# Patient Record
Sex: Male | Born: 1950 | Race: White | Hispanic: No | Smoking: Former smoker
Health system: Southern US, Community
[De-identification: ages and names within clinical notes are randomized; demographics above are authoritative.]

## PROBLEM LIST (undated history)

## (undated) DIAGNOSIS — E785 Hyperlipidemia, unspecified: Secondary | ICD-10-CM

## (undated) DIAGNOSIS — K429 Umbilical hernia without obstruction or gangrene: Secondary | ICD-10-CM

## (undated) DIAGNOSIS — E559 Vitamin D deficiency, unspecified: Secondary | ICD-10-CM

## (undated) HISTORY — PX: HERNIA REPAIR: SHX51

## (undated) HISTORY — PX: TONSILLECTOMY: SUR1361

---

## 2005-03-30 ENCOUNTER — Ambulatory Visit: Payer: Self-pay | Admitting: Gastroenterology

## 2006-04-17 ENCOUNTER — Ambulatory Visit: Payer: Self-pay | Admitting: Family Medicine

## 2007-12-29 ENCOUNTER — Ambulatory Visit: Payer: Self-pay | Admitting: Family Medicine

## 2008-05-31 ENCOUNTER — Emergency Department: Payer: Self-pay | Admitting: Unknown Physician Specialty

## 2008-06-24 ENCOUNTER — Ambulatory Visit: Payer: Self-pay | Admitting: Gastroenterology

## 2011-08-17 ENCOUNTER — Ambulatory Visit: Payer: Self-pay | Admitting: Gastroenterology

## 2015-03-04 ENCOUNTER — Emergency Department
Admission: EM | Admit: 2015-03-04 | Discharge: 2015-03-04 | Disposition: A | Discharge status: Home / Self Care | Payer: Commercial Indemnity | Attending: Emergency Medicine | Admitting: Emergency Medicine

## 2015-03-04 ENCOUNTER — Emergency Department: Payer: Commercial Indemnity

## 2015-03-04 ENCOUNTER — Encounter: Payer: Self-pay | Admitting: Emergency Medicine

## 2015-03-04 DIAGNOSIS — R109 Unspecified abdominal pain: Secondary | ICD-10-CM | POA: Diagnosis present

## 2015-03-04 DIAGNOSIS — K429 Umbilical hernia without obstruction or gangrene: Secondary | ICD-10-CM

## 2015-03-04 HISTORY — DX: Umbilical hernia without obstruction or gangrene: K42.9

## 2015-03-04 HISTORY — DX: Hyperlipidemia, unspecified: E78.5

## 2015-03-04 LAB — CBC WITH DIFFERENTIAL/PLATELET
BASOS ABS: 0 10*3/uL (ref 0–0.1)
Basophils Relative: 1 %
EOS PCT: 3 %
Eosinophils Absolute: 0.1 10*3/uL (ref 0–0.7)
HCT: 47.5 % (ref 40.0–52.0)
Hemoglobin: 16 g/dL (ref 13.0–18.0)
LYMPHS ABS: 1.3 10*3/uL (ref 1.0–3.6)
Lymphocytes Relative: 26 %
MCH: 30.7 pg (ref 26.0–34.0)
MCHC: 33.6 g/dL (ref 32.0–36.0)
MCV: 91.5 fL (ref 80.0–100.0)
Monocytes Absolute: 0.6 10*3/uL (ref 0.2–1.0)
Monocytes Relative: 11 %
Neutro Abs: 3 10*3/uL (ref 1.4–6.5)
Neutrophils Relative %: 59 %
PLATELETS: 177 10*3/uL (ref 150–440)
RBC: 5.19 MIL/uL (ref 4.40–5.90)
RDW: 14 % (ref 11.5–14.5)
WBC: 5.1 10*3/uL (ref 3.8–10.6)

## 2015-03-04 LAB — COMPREHENSIVE METABOLIC PANEL
ALT: 41 U/L (ref 17–63)
ANION GAP: 8 (ref 5–15)
AST: 37 U/L (ref 15–41)
Albumin: 4.3 g/dL (ref 3.5–5.0)
Alkaline Phosphatase: 72 U/L (ref 38–126)
BUN: 13 mg/dL (ref 6–20)
CALCIUM: 9.1 mg/dL (ref 8.9–10.3)
CHLORIDE: 107 mmol/L (ref 101–111)
CO2: 25 mmol/L (ref 22–32)
CREATININE: 1.01 mg/dL (ref 0.61–1.24)
GFR calc Af Amer: >60 mL/min (ref >60)
Glucose, Bld: 104 mg/dL — ABNORMAL HIGH (ref 65–99)
Potassium: 4.2 mmol/L (ref 3.5–5.1)
Sodium: 140 mmol/L (ref 135–145)
Total Bilirubin: 1 mg/dL (ref 0.3–1.2)
Total Protein: 7.3 g/dL (ref 6.5–8.1)

## 2015-03-04 MED ORDER — IOHEXOL 300 MG/ML  SOLN
100.0000 mL | Freq: Once | INTRAMUSCULAR | Status: AC | PRN
  Administered 2015-03-04: 100 mL via INTRAVENOUS

## 2015-03-04 MED ORDER — IOHEXOL 240 MG/ML SOLN
25.0000 mL | Freq: Once | INTRAMUSCULAR | Status: AC | PRN
  Administered 2015-03-04: 25 mL via ORAL

## 2015-03-04 NOTE — ED Notes (Signed)
Patient transported to CT 

## 2015-03-04 NOTE — ED Notes (Signed)
Pt to ed with c/o umbilical hernia hurting x 3 days.  Pt states was noted on last yearly evaluation by MD.  Pt states over last few days it has been "popping out" and causing pain.

## 2015-03-04 NOTE — ED Provider Notes (Signed)
-----------------------------------------   4:24 PM on 03/04/2015 -----------------------------------------  CT shows a small fat-containing umbilical hernia without incarceration of bowel loop, mild surrounding inflammatory changes. Patient continues to appear well. Discussed return precautions and need for outpatient surgical follow-up. He is comfortable with discharge plan.  Joanne Gavel, MD 03/04/15 [phone]

## 2015-03-04 NOTE — ED Provider Notes (Signed)
[facility] Emergency Department Provider Note  ____________________________________________  Time seen: 2 PM  I have reviewed the triage vital signs and the nursing notes.   HISTORY  Chief Complaint Abdominal Pain     HPI Marvin Coleman is a 64 y.o. male who presents with pain around his umbilicus for approximately 3 days. Patient reports he has had umbilical and inguinal hernia repairs.  He noted his doctor found a firm area above the umbilicus and he feels that it is getting larger and more painful. He reports no bowel movements, normal flatus. No fevers no chills.      Past Medical History  Diagnosis Date  . Umbilical hernia   . Hyperlipemia     There are no active problems to display for this patient.   Past Surgical History  Procedure Laterality Date  . Hernia repair      No current outpatient prescriptions on file.  Allergies Review of patient's allergies indicates no known allergies.  Family History  Problem Relation Age of Onset  . CVA Mother   . Cancer Father     Social History History  Substance Use Topics  . Smoking status: Never Smoker   . Smokeless tobacco: Not on file  . Alcohol Use: Yes    Review of Systems  Constitutional: Negative for fever. Eyes: Negative for visual changes. ENT: Negative for sore throat Cardiovascular: Negative for chest pain. Respiratory: Negative for shortness of breath. Gastrointestinal: Mild discomfort around umbilicus Genitourinary: Negative for dysuria. Musculoskeletal: Negative for back pain. Skin: Negative for rash. Neurological: Negative for headaches or focal weakness Psychiatric: No anxiety  10-point ROS otherwise negative.  ____________________________________________   PHYSICAL EXAM:  VITAL SIGNS: ED Triage Vitals  Enc Vitals Group     BP 03/04/15 1202 140/84 mmHg     Pulse Rate 03/04/15 1202 63     Resp 03/04/15 1202 20     Temp 03/04/15 1202 98.6 F (37 C)      Temp Source 03/04/15 1330 Oral     SpO2 03/04/15 1202 97 %     Weight 03/04/15 1202 215 lb (97.523 kg)     Height 03/04/15 1202 5\' 8"  (1.727 m)     Head Cir --      Peak Flow --      Pain Score 03/04/15 1202 4     Pain Loc --      Pain Edu? --      Excl. in [place]? --      Constitutional: Alert and oriented. Well appearing and in no distress. Eyes: Conjunctivae are normal.  ENT   Head: Normocephalic and atraumatic.   Mouth/Throat: Mucous membranes are moist. Cardiovascular: Normal rate, regular rhythm. Normal and symmetric distal pulses are present in all extremities. No murmurs, rubs, or gallops. Respiratory: Normal respiratory effort without tachypnea nor retractions. Breath sounds are clear and equal bilaterally.  Gastrointestinal: Soft and non-tender in all quadrants. No distention. 2 x 2 centimeter firm nodular structure at the 11:00 position of the umbilicus. No tenderness to palpation. No erythema Genitourinary: deferred Musculoskeletal: Nontender with normal range of motion in all extremities. No lower extremity tenderness nor edema. Neurologic:  Normal speech and language. No gross focal neurologic deficits are appreciated. Skin:  Skin is warm, dry and intact. No rash noted. Psychiatric: Mood and affect are normal. Patient exhibits appropriate insight and judgment.  ____________________________________________    LABS (pertinent positives/negatives)  Labs Reviewed  COMPREHENSIVE METABOLIC PANEL - Abnormal; Notable for the following:  Glucose, Bld 104 (*)    All other components within normal limits  CBC WITH DIFFERENTIAL/PLATELET    ____________________________________________   EKG  None  ____________________________________________    RADIOLOGY  Pending CT scan  ____________________________________________   PROCEDURES  Procedure(s) performed: none  Critical Care performed:  none  ____________________________________________   INITIAL IMPRESSION / ASSESSMENT AND PLAN / ED COURSE  Pertinent labs & imaging results that were available during my care of the patient were reviewed by me and considered in my medical decision making (see chart for details).  Patient overall well-appearing. Unclear cause of discomfort, we'll obtain CT abdomen pelvis to rule out hernia.  ____________________________________________ ----------------------------------------- 3:26 PM on 03/04/2015 -----------------------------------------  Signed out to Dr. Edd Fabian to follow up on CT  FINAL CLINICAL IMPRESSION(S) / ED DIAGNOSES  Abdominal pain   Lavonia Drafts, MD 03/04/15 1527

## 2015-11-12 IMAGING — CT CT ABD-PELV W/ CM
1 of 3 series · 14 of 32 positions shown, 19 images · IV contrast (omnipaque)
Comparison: None.

CLINICAL DATA: Known history of umbilical hernia with pain for 3
days

EXAM:
CT ABDOMEN AND PELVIS WITH CONTRAST
TECHNIQUE: Multidetector CT imaging of the abdomen and pelvis was performed
using the standard protocol following bolus administration of
intravenous contrast.
CONTRAST:  100mL OMNIPAQUE IOHEXOL 300 MG/ML  SOLN

[Series 2: routine abd pel with · axial · 0.80mm/px · z∈[-488,-58]mm · 14 of 98 slices shown, 19 images]
[im 6/98  soft-tissue]
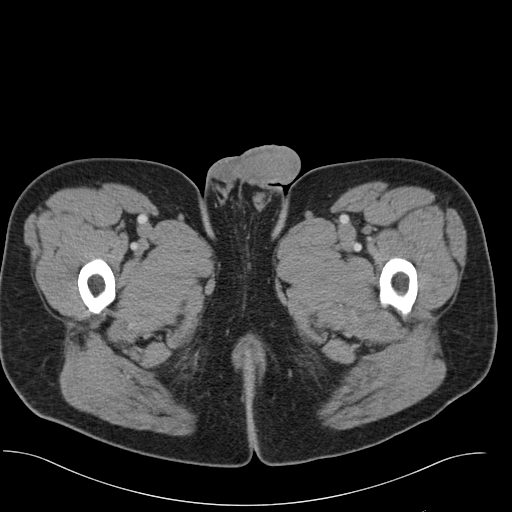
[im 6/98  bone]
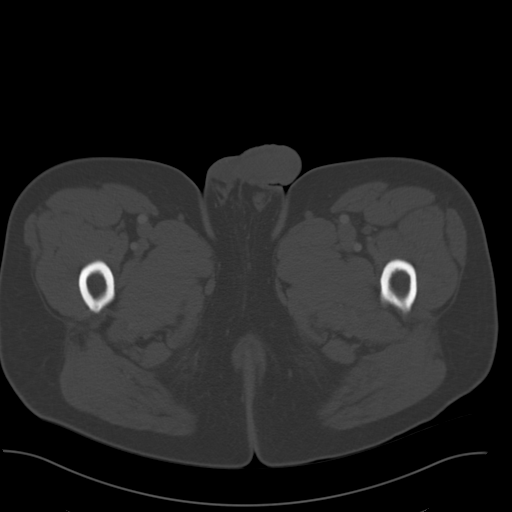
[im 11/98  soft-tissue]
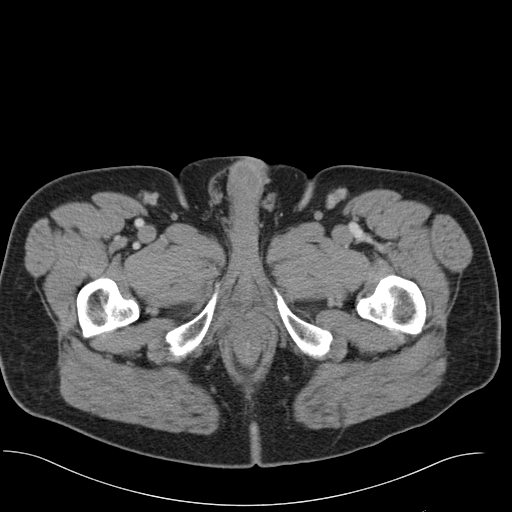
[im 22/98  soft-tissue]
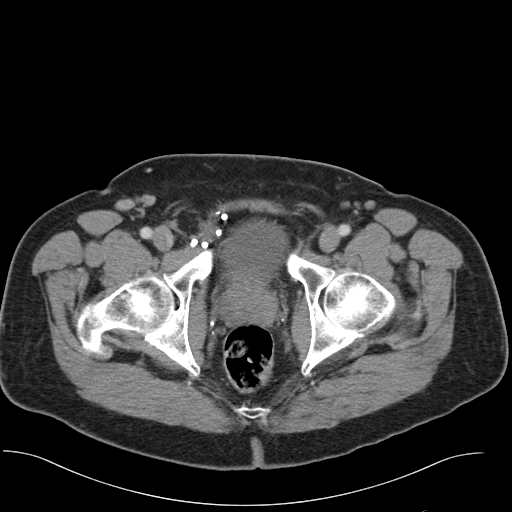
[im 27/98  soft-tissue]
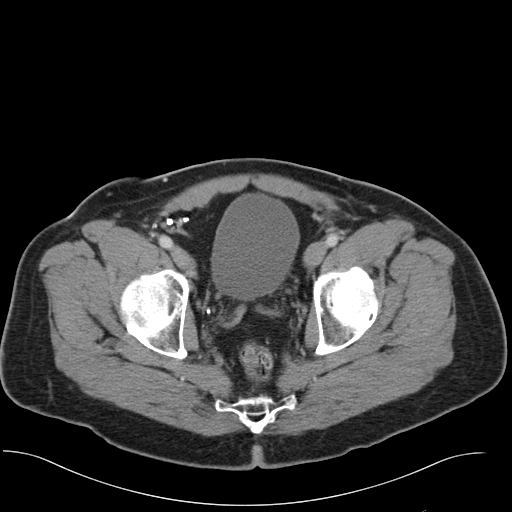
[im 33/98  soft-tissue]
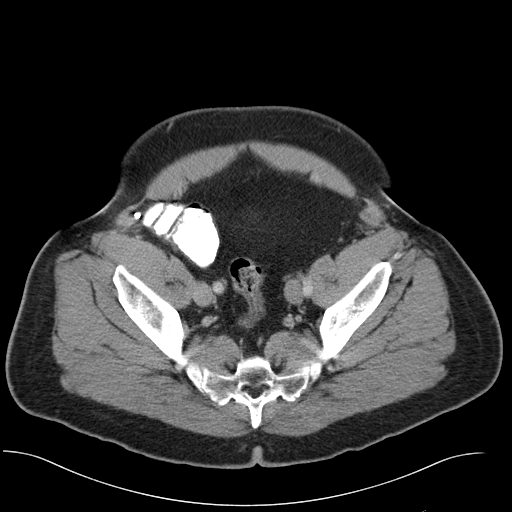
[im 44/98  soft-tissue]
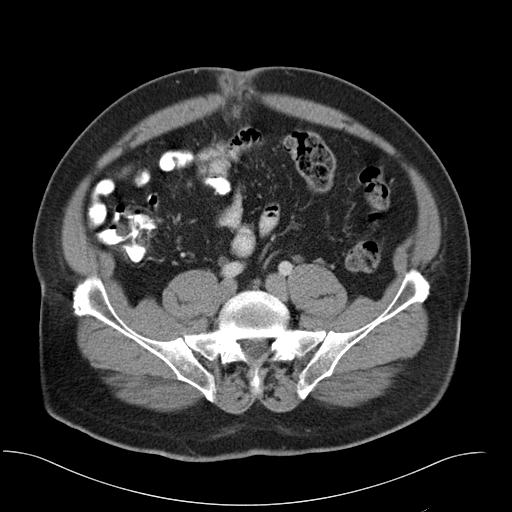
[im 49/98  soft-tissue]
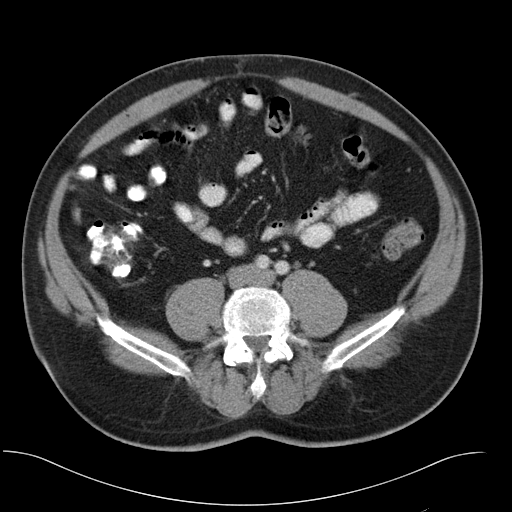
[im 54/98  soft-tissue]
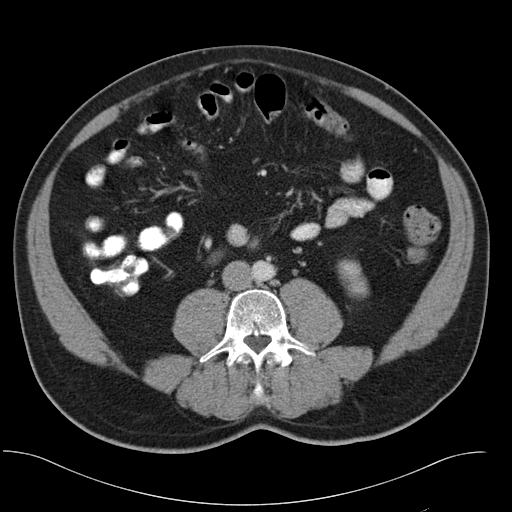
[im 65/98  soft-tissue]
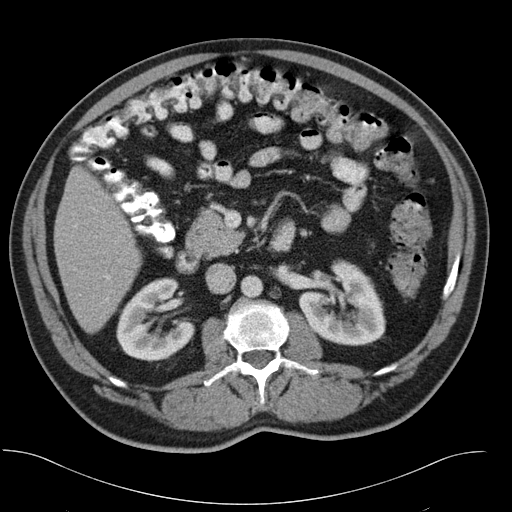
[im 65/98  bone]
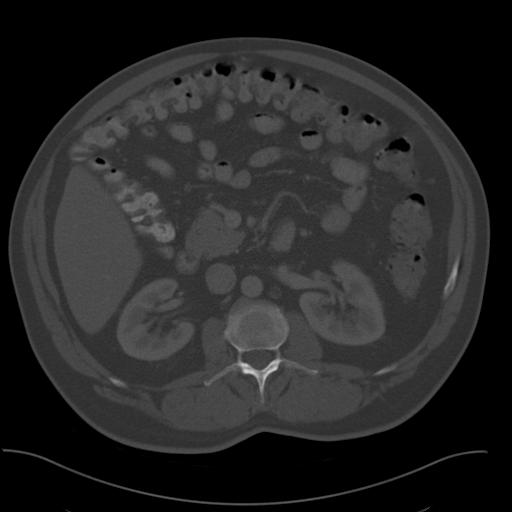
[im 71/98  soft-tissue]
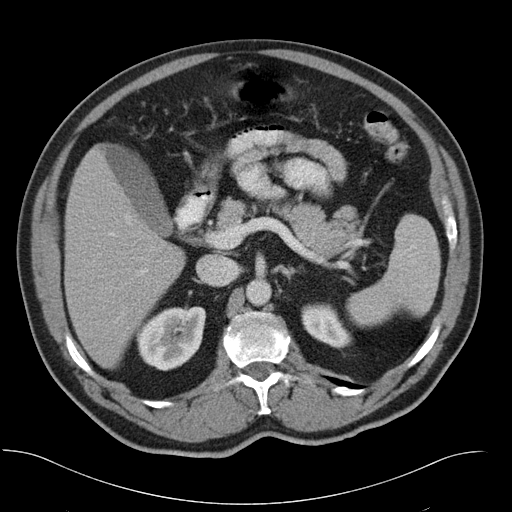
[im 76/98  soft-tissue]
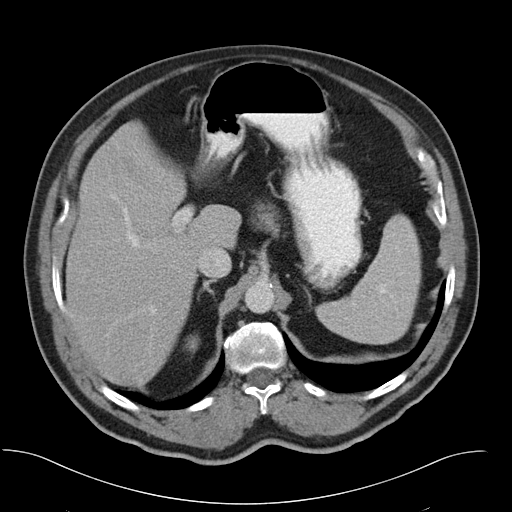
[im 76/98  lung]
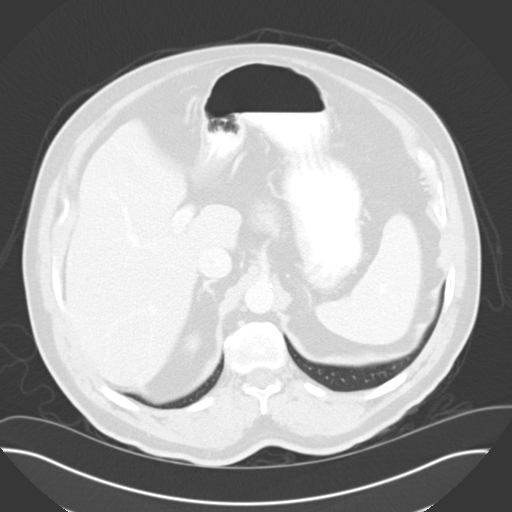
[im 81/98  lung]
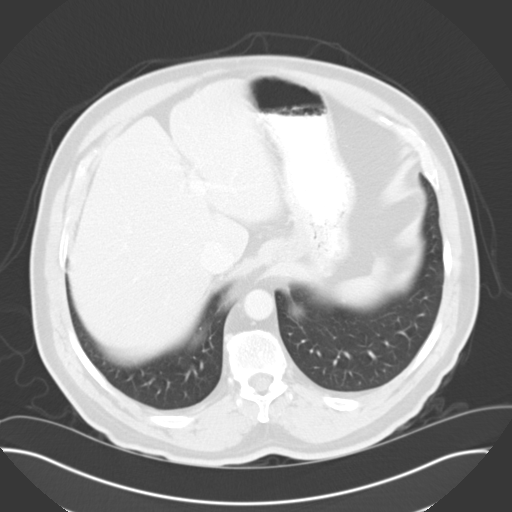
[im 87/98  soft-tissue]
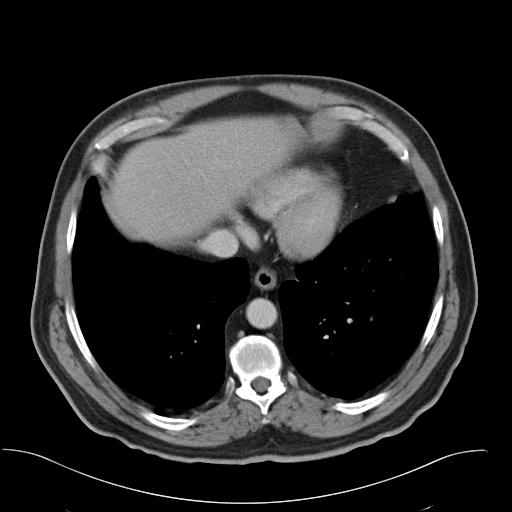
[im 87/98  lung]
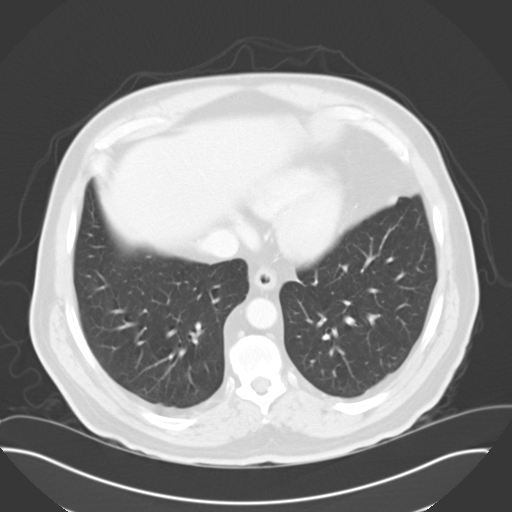
[im 92/98  soft-tissue]
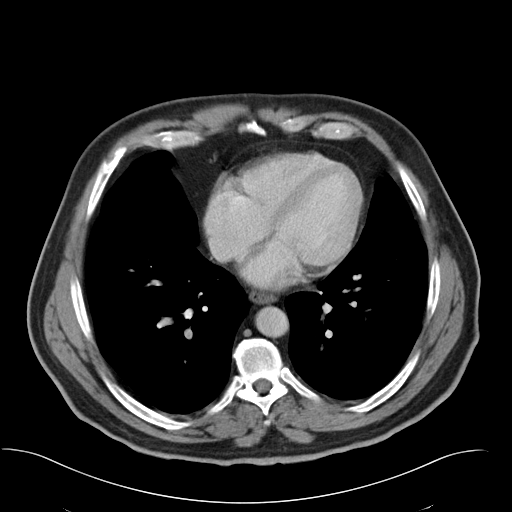
[im 92/98  lung]
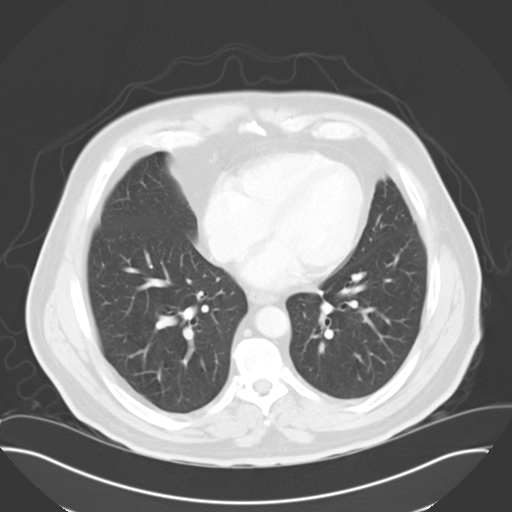

[14 of 32 positions shown; findings below may reference images not displayed]

FINDINGS: Lung bases are free of acute infiltrate or sizable effusion.

The liver, gallbladder, spleen, adrenal glands and pancreas are
within normal limits. Kidneys are well visualized bilaterally. No
renal or ureteral calculi are seen. The bladder is partially
distended.

Postsurgical changes are noted in the right inguinal region
consistent with prior hernia repair. No inguinal hernia is noted. A
mild fat containing umbilical hernia is seen with some inflammatory
changes identified. No incarceration of bowel loops is identified.
The appendix is well visualized without significant inflammatory
change. No significant diverticular disease of the colon is noted.
No pelvic mass lesion or sidewall abnormality is noted. The osseous
structures show no acute abnormality.
IMPRESSION: Small fat containing umbilical hernia with some surrounding
inflammatory changes. No other focal abnormality is noted.

## 2016-12-10 ENCOUNTER — Encounter: Payer: Self-pay | Admitting: *Deleted

## 2016-12-11 ENCOUNTER — Encounter
Admission: RE | Disposition: A | Discharge status: Home / Self Care | Payer: Self-pay | Source: Ambulatory Visit | Attending: Gastroenterology

## 2016-12-11 ENCOUNTER — Ambulatory Visit: Payer: Medicare Other | Admitting: Anesthesiology

## 2016-12-11 ENCOUNTER — Ambulatory Visit
Admission: RE | Admit: 2016-12-11 | Discharge: 2016-12-11 | Disposition: A | Discharge status: Home / Self Care | Payer: Medicare Other | Source: Ambulatory Visit | Attending: Gastroenterology | Admitting: Gastroenterology

## 2016-12-11 ENCOUNTER — Encounter: Payer: Self-pay | Admitting: Anesthesiology

## 2016-12-11 DIAGNOSIS — E785 Hyperlipidemia, unspecified: Secondary | ICD-10-CM | POA: Diagnosis not present

## 2016-12-11 DIAGNOSIS — Z79899 Other long term (current) drug therapy: Secondary | ICD-10-CM | POA: Diagnosis not present

## 2016-12-11 DIAGNOSIS — Z7982 Long term (current) use of aspirin: Secondary | ICD-10-CM | POA: Insufficient documentation

## 2016-12-11 DIAGNOSIS — D121 Benign neoplasm of appendix: Secondary | ICD-10-CM | POA: Insufficient documentation

## 2016-12-11 DIAGNOSIS — K5730 Diverticulosis of large intestine without perforation or abscess without bleeding: Secondary | ICD-10-CM | POA: Insufficient documentation

## 2016-12-11 DIAGNOSIS — Z1211 Encounter for screening for malignant neoplasm of colon: Secondary | ICD-10-CM | POA: Diagnosis not present

## 2016-12-11 DIAGNOSIS — Z86010 Personal history of colonic polyps: Secondary | ICD-10-CM | POA: Insufficient documentation

## 2016-12-11 HISTORY — PX: COLONOSCOPY: SHX5424

## 2016-12-11 SURGERY — COLONOSCOPY
Anesthesia: General

## 2016-12-11 MED ORDER — MIDAZOLAM HCL 2 MG/2ML IJ SOLN
INTRAMUSCULAR | Status: DC | PRN
  Administered 2016-12-11: 1 mg via INTRAVENOUS

## 2016-12-11 MED ORDER — PROPOFOL 500 MG/50ML IV EMUL
INTRAVENOUS | Status: DC | PRN
  Administered 2016-12-11: 140 ug/kg/min via INTRAVENOUS

## 2016-12-11 MED ORDER — PROPOFOL 500 MG/50ML IV EMUL
INTRAVENOUS | Status: AC
  Filled 2016-12-11: qty 50

## 2016-12-11 MED ORDER — PROPOFOL 10 MG/ML IV BOLUS
INTRAVENOUS | Status: DC | PRN
  Administered 2016-12-11: 50 mg via INTRAVENOUS

## 2016-12-11 MED ORDER — MIDAZOLAM HCL 2 MG/2ML IJ SOLN
INTRAMUSCULAR | Status: AC
  Filled 2016-12-11: qty 2

## 2016-12-11 MED ORDER — FENTANYL CITRATE (PF) 100 MCG/2ML IJ SOLN
INTRAMUSCULAR | Status: AC
  Filled 2016-12-11: qty 2

## 2016-12-11 MED ORDER — SODIUM CHLORIDE 0.9 % IV SOLN
INTRAVENOUS | Status: DC
Start: 2016-12-11 — End: 2016-12-11
  Administered 2016-12-11: 09:00:00 via INTRAVENOUS
  Administered 2016-12-11: 1000 mL via INTRAVENOUS

## 2016-12-11 MED ORDER — FENTANYL CITRATE (PF) 100 MCG/2ML IJ SOLN
INTRAMUSCULAR | Status: DC | PRN
  Administered 2016-12-11 (×2): 50 ug via INTRAVENOUS

## 2016-12-11 MED ORDER — SODIUM CHLORIDE 0.9 % IV SOLN: INTRAVENOUS | Status: DC

## 2016-12-11 MED ORDER — LIDOCAINE HCL (PF) 2 % IJ SOLN
INTRAMUSCULAR | Status: AC
  Filled 2016-12-11: qty 2

## 2016-12-11 NOTE — Op Note (Signed)
[facility] Gastroenterology Patient Name: Marvin Coleman Procedure Date: 12/11/2016 8:30 AM MRN: [id number] Account #: [id number] Date of Birth: [birth year] Admit Type: Outpatient Age: 66 Room: [facility] ENDO ROOM 3 Gender: Male Note Status: Finalized Procedure:            Colonoscopy Indications:          Personal history of colonic polyps Providers:            Lollie Sails, MD Referring MD:         Irven Easterly. Kary Kos, MD (Referring MD) Medicines:            Monitored Anesthesia Care Complications:        No immediate complications. Procedure:            Pre-Anesthesia Assessment:                       - ASA Grade Assessment: II - A patient with mild                        systemic disease.                       After obtaining informed consent, the colonoscope was                        passed under direct vision. Throughout the procedure,                        the patient's blood pressure, pulse, and oxygen                        saturations were monitored continuously. The                        Colonoscope was introduced through the anus and                        advanced to the the cecum, identified by appendiceal                        orifice and ileocecal valve. The colonoscopy was                        performed with moderate difficulty. Successful                        completion of the procedure was aided by changing the                        patient to a prone position and using manual pressure.                        The patient tolerated the procedure well. The quality                        of the bowel preparation was good. Findings:      A 1 mm polyp was found in the appendiceal orifice. The polyp was       sessile. The polyp was removed with a cold biopsy forceps. Resection and       retrieval were  complete.      Multiple small-mouthed diverticula were found in the sigmoid colon and       descending colon.      The retroflexed view of the  distal rectum and anal verge was normal and       showed no anal or rectal abnormalities.      The digital rectal exam was normal. Impression:           - One 1 mm polyp at the appendiceal orifice, removed                        with a cold biopsy forceps. Resected and retrieved.                       - Diverticulosis in the sigmoid colon and in the                        descending colon.                       - The distal rectum and anal verge are normal on                        retroflexion view. Recommendation:       - Discharge patient to home. 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 All rights reserved. The codes documented in this report are preliminary and upon coder review may  be revised to meet current compliance requirements. Lollie Sails, MD 12/11/2016 9:17:00 AM This report has been signed electronically. Number of Addenda: 0 Note Initiated On: 12/11/2016 8:30 AM Scope Withdrawal Time: 0 hours 8 minutes 31 seconds  Total Procedure Duration: 0 hours 28 minutes 11 seconds       [facility]

## 2016-12-11 NOTE — H&P (Signed)
Outpatient short stay form Pre-procedure 12/11/2016 8:22 AM Lollie Sails MD  Primary Physician: Dr. [place] Pink  Reason for visit:  Colonoscopy  History of present illness:  Patient is a 66 year old male presenting today as above. He has personal history of adenomatous colon polyps with his last colonoscopy in 2012. He tolerated his prep well. He takes no aspirin or blood thinning agents with the exception of 81 mg aspirin that has been held for several days.    Current Facility-Administered Medications:  .  0.9 %  sodium chloride infusion, , Intravenous, Continuous, Lollie Sails, MD, Last Rate: 20 mL/hr at 12/11/16 0801, 1,000 mL at 12/11/16 0801 .  0.9 %  sodium chloride infusion, , Intravenous, Continuous, Lollie Sails, MD  Prescriptions Prior to Admission  Medication Sig Dispense Refill Last Dose  . aspirin EC 81 MG tablet Take 81 mg by mouth daily.   Past Week at Unknown time  . Cholecalciferol (D3-1000) 1000 units capsule Take 1,000 Units by mouth daily.   Past Week at Unknown time  . pravastatin (PRAVACHOL) 10 MG tablet Take 10 mg by mouth daily.   Past Week at Unknown time     No Known Allergies   Past Medical History:  Diagnosis Date  . Hyperlipemia   . Umbilical hernia     Review of systems:      Physical Exam    Heart and lungs: Regular rate and rhythm without rub or gallop, lungs are bilaterally clear.    HEENT: Normocephalic atraumatic eyes are anicteric    Other:     Pertinant exam for procedure: Soft nontender nondistended bowel sounds positive normoactive, protuberant.    Planned proceedures: Colonoscopy and indicated procedures. I have discussed the risks benefits and complications of procedures to include not limited to bleeding, infection, perforation and the risk of sedation and the patient wishes to proceed.    Lollie Sails, MD Gastroenterology 12/11/2016  8:22 AM

## 2016-12-11 NOTE — Anesthesia Preprocedure Evaluation (Signed)
Anesthesia Evaluation  Patient identified by MRN, date of birth, ID band Patient awake    Reviewed: Allergy & Precautions, NPO status , Patient's Chart, lab work & pertinent test results, reviewed documented beta blocker date and time   Airway Mallampati: II  TM Distance: >3 FB     Dental  (+) Chipped   Pulmonary           Cardiovascular      Neuro/Psych    GI/Hepatic   Endo/Other    Renal/GU      Musculoskeletal   Abdominal   Peds  Hematology   Anesthesia Other Findings   Reproductive/Obstetrics                             Anesthesia Physical Anesthesia Plan  ASA: II  Anesthesia Plan: General   Post-op Pain Management:    Induction: Intravenous  Airway Management Planned:   Additional Equipment:   Intra-op Plan:   Post-operative Plan:   Informed Consent: I have reviewed the patients History and Physical, chart, labs and discussed the procedure including the risks, benefits and alternatives for the proposed anesthesia with the patient or authorized representative who has indicated his/her understanding and acceptance.     Plan Discussed with: CRNA  Anesthesia Plan Comments:         Anesthesia Quick Evaluation

## 2016-12-11 NOTE — Anesthesia Post-op Follow-up Note (Cosign Needed)
Anesthesia QCDR form completed.        

## 2016-12-11 NOTE — Anesthesia Postprocedure Evaluation (Signed)
Anesthesia Post Note  Patient: Marvin Coleman  Procedure(s) Performed: Procedure(s) (LRB): COLONOSCOPY (N/A)  Patient location during evaluation: Endoscopy Anesthesia Type: General Level of consciousness: awake and alert Pain management: pain level controlled Vital Signs Assessment: post-procedure vital signs reviewed and stable Respiratory status: spontaneous breathing, nonlabored ventilation, respiratory function stable and patient connected to nasal cannula oxygen Cardiovascular status: blood pressure returned to baseline and stable Postop Assessment: no signs of nausea or vomiting Anesthetic complications: no     Last Vitals:  Vitals:   12/11/16 0940 12/11/16 0950  BP: (!) 145/93 (!) 142/94  Pulse: 72 68  Resp: 16 (!) 21  Temp:      Last Pain:  Vitals:   12/11/16 0920  TempSrc: Tympanic                 Riyan Haile S

## 2016-12-11 NOTE — Anesthesia Procedure Notes (Signed)
Date/Time: 12/11/2016 8:40 AM Performed by: Allean Found Pre-anesthesia Checklist: Patient identified, Emergency Drugs available, Suction available, Patient being monitored and Timeout performed Oxygen Delivery Method: Nasal cannula Intubation Type: IV induction Placement Confirmation: positive ETCO2

## 2016-12-11 NOTE — Transfer of Care (Signed)
Immediate Anesthesia Transfer of Care Note  Patient: Marvin Coleman  Procedure(s) Performed: Procedure(s): COLONOSCOPY (N/A)  Patient Location: PACU  Anesthesia Type:General  Level of Consciousness: sedated  Airway & Oxygen Therapy: Patient Spontanous Breathing and Patient connected to nasal cannula oxygen  Post-op Assessment: Report given to RN and Post -op Vital signs reviewed and stable  Post vital signs: Reviewed and stable  Last Vitals:  Vitals:   12/11/16 0746  BP: (!) 150/88  Pulse: 75  Resp: 18  Temp: (!) 35.8 C    Last Pain:  Vitals:   12/11/16 0746  TempSrc: Tympanic         Complications: No apparent anesthesia complications

## 2016-12-11 NOTE — Anesthesia Postprocedure Evaluation (Signed)
Anesthesia Post Note  Patient: Marvin Coleman  Procedure(s) Performed: Procedure(s) (LRB): COLONOSCOPY (N/A)  Patient location during evaluation: Endoscopy Anesthesia Type: General Level of consciousness: awake and alert Pain management: pain level controlled Vital Signs Assessment: post-procedure vital signs reviewed and stable Respiratory status: spontaneous breathing, nonlabored ventilation, respiratory function stable and patient connected to nasal cannula oxygen Cardiovascular status: blood pressure returned to baseline and stable Postop Assessment: no signs of nausea or vomiting Anesthetic complications: no     Last Vitals:  Vitals:   12/11/16 0940 12/11/16 0950  BP: (!) 145/93 (!) 142/94  Pulse: 72 68  Resp: 16 (!) 21  Temp:      Last Pain:  Vitals:   12/11/16 0920  TempSrc: Tympanic                 [name]

## 2016-12-12 ENCOUNTER — Encounter: Payer: Self-pay | Admitting: Gastroenterology

## 2016-12-12 LAB — SURGICAL PATHOLOGY

## 2017-05-17 ENCOUNTER — Encounter (INDEPENDENT_AMBULATORY_CARE_PROVIDER_SITE_OTHER): Payer: Self-pay | Admitting: Vascular Surgery

## 2017-05-17 ENCOUNTER — Ambulatory Visit (INDEPENDENT_AMBULATORY_CARE_PROVIDER_SITE_OTHER): Payer: Medicare Other | Admitting: Vascular Surgery

## 2017-05-17 VITALS — BP 127/81 | HR 63 | Resp 15 | Ht 68.0 in | Wt 231.0 lb

## 2017-05-17 DIAGNOSIS — E785 Hyperlipidemia, unspecified: Secondary | ICD-10-CM

## 2017-05-17 DIAGNOSIS — M7989 Other specified soft tissue disorders: Secondary | ICD-10-CM | POA: Insufficient documentation

## 2017-05-17 NOTE — Patient Instructions (Signed)

## 2017-05-17 NOTE — Progress Notes (Signed)
Patient ID: Marvin Coleman, male   DOB: 1951-08-25, 66 y.o.   MRN: [id number]  Chief Complaint  Patient presents with  . New Patient (Initial Visit)    Leg swelling    HPI Marvin Coleman is a 66 y.o. male.  I am asked to see the patient by Dr. Kary Kos for evaluation of leg swelling. There was no clear inciting event or causative factor that started the symptoms.  The left leg is more severly affected. The patient elevates the legs for relief and has started walking more. The pain is described as heaviness and tiredness in the legs. The symptoms are generally most severe in the evening, particularly when they have been on their feet for long periods of time. walking has been used to try to improve the symptoms with some success. Lasix has not helped. The patient has no previous history of deep venous thrombosis or superficial thrombophlebitis to their knowledge.     Past Medical History:  Diagnosis Date  . Hyperlipemia   . Umbilical hernia     Past Surgical History:  Procedure Laterality Date  . COLONOSCOPY N/A 12/11/2016   Procedure: COLONOSCOPY;  Surgeon: Lollie Sails, MD;  Location: [facility] ENDOSCOPY;  Service: Endoscopy;  Laterality: N/A;  . HERNIA REPAIR      Family History  Problem Relation Age of Onset  . CVA Mother   . Cancer Father    No bleeding or clotting disordrers  Social History Social History  Substance Use Topics  . Smoking status: Never Smoker  . Smokeless tobacco: Never Used  . Alcohol use Yes  No IVDU  No Known Allergies  Current Outpatient Prescriptions  Medication Sig Dispense Refill  . aspirin EC 81 MG tablet Take 81 mg by mouth daily.    . Cholecalciferol (D3-1000) 1000 units capsule Take 1,000 Units by mouth daily.    . Multiple Vitamin (MULTIVITAMIN) capsule Take by mouth.    . pravastatin (PRAVACHOL) 10 MG tablet Take 10 mg by mouth daily.     No current facility-administered medications for this visit.       REVIEW OF SYSTEMS  (Negative unless checked)  Constitutional: [] Weight loss  [] Fever  [] Chills Cardiac: [] Chest pain   [] Chest pressure   [] Palpitations   [] Shortness of breath when laying flat   [] Shortness of breath at rest   [] Shortness of breath with exertion. Vascular:  [] Pain in legs with walking   [] Pain in legs at rest   [] Pain in legs when laying flat   [] Claudication   [] Pain in feet when walking  [] Pain in feet at rest  [] Pain in feet when laying flat   [] History of DVT   [] Phlebitis   [x] Swelling in legs   [x] Varicose veins   [] Non-healing ulcers Pulmonary:   [] Uses home oxygen   [] Productive cough   [] Hemoptysis   [] Wheeze  [] COPD   [] Asthma Neurologic:  [] Dizziness  [] Blackouts   [] Seizures   [] History of stroke   [] History of TIA  [] Aphasia   [] Temporary blindness   [] Dysphagia   [] Weakness or numbness in arms   [] Weakness or numbness in legs Musculoskeletal:  [x] Arthritis   [] Joint swelling   [] Joint pain   [] Low back pain Hematologic:  [] Easy bruising  [] Easy bleeding   [] Hypercoagulable state   [] Anemic  [] Hepatitis Gastrointestinal:  [] Blood in stool   [] Vomiting blood  [] Gastroesophageal reflux/heartburn   [] Abdominal pain Genitourinary:  [] Chronic kidney disease   [] Difficult urination  [] Frequent urination  []   Burning with urination   [] Hematuria Skin:  [] Rashes   [] Ulcers   [] Wounds Psychological:  [] History of anxiety   []  History of major depression.    Physical Exam BP 127/81 (BP Location: Right Arm)   Pulse 63   Resp 15   Ht 5\' 8"  (1.727 m)   Wt 104.8 kg (231 lb)   BMI 35.12 kg/m  Gen:  WD/WN, NAD Head: [place]/AT, No temporalis wasting.  Ear/Nose/Throat: Hearing grossly intact, dentition good Eyes: Sclera non-icteric. Conjunctiva clear Neck: Supple, no nuchal rigidity. Trachea midline Pulmonary:  Good air movement, no use of accessory muscles, respirations not labored.  Cardiac: RRR, No JVD Vascular: Varicosities scattered and measuring up to 1-2 mm in the right lower extremity         Varicosities scattered and measuring up to 2 mm in the left lower extremity Vessel Right Left  Radial Palpable Palpable  Ulnar Palpable Palpable  Brachial Palpable Palpable  Carotid Palpable, without bruit Palpable, without bruit  Aorta Not palpable N/A  Femoral Palpable Palpable  Popliteal Palpable Palpable  PT Palpable Palpable  DP Palpable Palpable   Gastrointestinal: soft, non-tender/non-distended.  Musculoskeletal: M/S 5/5 throughout.   1-2 + RLE edema.  2 + LLE edema Neurologic: Sensation grossly intact in extremities.  Symmetrical.  Speech is fluent.  Psychiatric: Judgment intact, Mood & affect appropriate for pt's clinical situation. Dermatologic: No rashes or ulcers noted.  No cellulitis or open wounds.    Radiology No results found.  Labs No results found for this or any previous visit (from the past 2160 hour(s)).  Assessment/Plan:  Hyperlipemia lipid control important in reducing the progression of atherosclerotic disease. Continue statin therapy   Swelling of limb   The patient has symptoms consistent with chronic venous insufficiency. We discussed the natural history and treatment options for venous disease. I recommended the regular use of 20 - 30 mm Hg compression stockings, and prescribed these today. I recommended leg elevation and anti-inflammatories as needed for pain. I have also recommended a complete venous duplex to assess the venous system for reflux or thrombotic issues. This can be done at the patient's convenience. I will see the patient back after their duplex to assess the response to conservative management, and determine further treatment options.     Leotis Pain 05/17/2017, 1:52 PM   This note was created with Dragon medical transcription system.  Any errors from dictation are unintentional.

## 2017-05-17 NOTE — Assessment & Plan Note (Signed)
lipid control important in reducing the progression of atherosclerotic disease. Continue statin therapy  

## 2017-06-28 ENCOUNTER — Ambulatory Visit (INDEPENDENT_AMBULATORY_CARE_PROVIDER_SITE_OTHER): Payer: Medicare Other | Admitting: Vascular Surgery

## 2017-06-28 ENCOUNTER — Encounter (INDEPENDENT_AMBULATORY_CARE_PROVIDER_SITE_OTHER): Payer: Self-pay | Admitting: Vascular Surgery

## 2017-06-28 ENCOUNTER — Ambulatory Visit (INDEPENDENT_AMBULATORY_CARE_PROVIDER_SITE_OTHER): Payer: Medicare Other

## 2017-06-28 VITALS — BP 142/82 | HR 64 | Resp 14 | Ht 68.0 in | Wt 231.0 lb

## 2017-06-28 DIAGNOSIS — I890 Lymphedema, not elsewhere classified: Secondary | ICD-10-CM | POA: Insufficient documentation

## 2017-06-28 DIAGNOSIS — M7989 Other specified soft tissue disorders: Secondary | ICD-10-CM | POA: Diagnosis not present

## 2017-06-28 DIAGNOSIS — E785 Hyperlipidemia, unspecified: Secondary | ICD-10-CM

## 2017-06-28 DIAGNOSIS — I872 Venous insufficiency (chronic) (peripheral): Secondary | ICD-10-CM

## 2017-06-28 NOTE — Progress Notes (Signed)
Subjective:    Patient ID: Marvin Coleman, male    DOB: November 08, 1950, 66 y.o.   MRN: [id number] Chief Complaint  Patient presents with  . Follow-up    6 week f/u   Patient presents to review vascular studies. He was last seen on May 17, 2017 for evaluation of lower extremity edema. Since his initial visit, the patient has been engaging in conservative therapy including wearing medical grade one compression stockings, elevating his legs and remaining active. Patient states this seems to be controlling his symptoms. Patient notes an improvement to the swelling in his bilateral lower extremity. The patient underwent a bilateral lower extremity venous reflux exam which was notable for venous incompetence in the right common femoral vein, saphenous femoral junction and the greater saphenous vein of the right lower extremity.Venous incompetence noted in the left common femoral vein, saphenofemoral junction and the great saphenous vein of the left lower extremity. No evidence of deep or superficial vein thrombosis in the bilateral lower extremities.The patient denies any fever, nausea or vomiting.   Review of Systems  Constitutional: Negative.   HENT: Negative.   Eyes: Negative.   Respiratory: Negative.   Cardiovascular: Negative.   Gastrointestinal: Negative.   Endocrine: Negative.   Genitourinary: Negative.   Musculoskeletal: Negative.   Skin: Negative.   Allergic/Immunologic: Negative.   Neurological: Negative.   Hematological: Negative.   Psychiatric/Behavioral: Negative.       Objective:   Physical Exam  Constitutional: He is oriented to person, place, and time. He appears well-developed. No distress.  HENT:  Head: Normocephalic and atraumatic.  Eyes: Pupils are equal, round, and reactive to light. Conjunctivae are normal.  Neck: Normal range of motion.  Cardiovascular: Normal rate, regular rhythm, normal heart sounds and intact distal pulses.   Pulses:      Radial pulses  are 2+ on the right side, and 2+ on the left side.       Dorsalis pedis pulses are 2+ on the right side, and 2+ on the left side.       Posterior tibial pulses are 2+ on the right side, and 2+ on the left side.  Pulmonary/Chest: Effort normal.  Musculoskeletal: Normal range of motion. He exhibits no edema.  Neurological: He is alert and oriented to person, place, and time.  Skin: Skin is warm and dry. He is not diaphoretic.  Psychiatric: He has a normal mood and affect. His behavior is normal. Judgment and thought content normal.  Vitals reviewed.  BP (!) 142/82 (BP Location: Right Arm)   Pulse 64   Resp 14   Ht 5\' 8"  (1.727 m)   Wt 231 lb (104.8 kg)   BMI 35.12 kg/m   Past Medical History:  Diagnosis Date  . Hyperlipemia   . Umbilical hernia    Social History   Social History  . Marital status: Married    Spouse name: N/A  . Number of children: N/A  . Years of education: N/A   Occupational History  . Not on file.   Social History Main Topics  . Smoking status: Never Smoker  . Smokeless tobacco: Never Used  . Alcohol use Yes  . Drug use: No  . Sexual activity: Not on file   Other Topics Concern  . Not on file   Social History Narrative  . No narrative on file   Past Surgical History:  Procedure Laterality Date  . COLONOSCOPY N/A 12/11/2016   Procedure: COLONOSCOPY;  Surgeon: Lollie Sails, MD;  Location: [facility] ENDOSCOPY;  Service: Endoscopy;  Laterality: N/A;  . HERNIA REPAIR     Family History  Problem Relation Age of Onset  . CVA Mother   . Cancer Father    No Known Allergies     Assessment & Plan:  Patient presents to review vascular studies. He was last seen on May 17, 2017 for evaluation of lower extremity edema. Since his initial visit, the patient has been engaging in conservative therapy including wearing medical grade one compression stockings, elevating his legs and remaining active. Patient states this seems to be controlling his  symptoms. Patient notes an improvement to the swelling in his bilateral lower extremity. The patient underwent a bilateral lower extremity venous reflux exam which was notable for venous incompetence in the right common femoral vein, saphenous femoral junction and the greater saphenous vein of the right lower extremity.Venous incompetence noted in the left common femoral vein, saphenofemoral junction and the great saphenous vein of the left lower extremity. No evidence of deep or superficial vein thrombosis in the bilateral lower extremities.The patient denies any fever, nausea or vomiting.  1. Chronic venous insufficiency - New Conservative therapy seems to have improved the patient's bilateral lower extremity edema / discomfort. We discussed laser ablation to the bilateral greater saphenous veins if conservative therapy should fail in the future. At this time, the patient would like to continue engaging in conservative therapy. Patient to follow up as needed  2. Lymphedema - New As above  3. Hyperlipidemia, unspecified hyperlipidemia type - stable Encouraged good control as its slows the progression of atherosclerotic disease  Current Outpatient Prescriptions on File Prior to Visit  Medication Sig Dispense Refill  . aspirin EC 81 MG tablet Take 81 mg by mouth daily.    . Cholecalciferol (D3-1000) 1000 units capsule Take 1,000 Units by mouth daily.    . Multiple Vitamin (MULTIVITAMIN) capsule Take by mouth.    . pravastatin (PRAVACHOL) 10 MG tablet Take 10 mg by mouth daily.     No current facility-administered medications on file prior to visit.     There are no Patient Instructions on file for this visit. No Follow-up on file.   KIMBERLY A STEGMAYER, PA-C

## 2019-09-14 ENCOUNTER — Ambulatory Visit: Payer: Commercial Indemnity | Attending: Internal Medicine

## 2019-09-14 DIAGNOSIS — Z20822 Contact with and (suspected) exposure to covid-19: Secondary | ICD-10-CM

## 2019-09-15 LAB — NOVEL CORONAVIRUS, NAA: SARS-CoV-2, NAA: NOT DETECTED

## 2019-10-23 ENCOUNTER — Ambulatory Visit: Payer: Medicare Other | Attending: Internal Medicine

## 2019-10-23 DIAGNOSIS — Z23 Encounter for immunization: Secondary | ICD-10-CM

## 2019-10-23 NOTE — Progress Notes (Signed)
   Covid-19 Vaccination Clinic  Name:  Marvin Coleman    MRN: [id number] DOB: [birth year]  10/23/2019  Marvin Coleman was observed post Covid-19 immunization for 15 minutes without incidence. He was provided with Vaccine Information Sheet and instruction to access the V-Safe system.   Marvin Coleman was instructed to call 911 with any severe reactions post vaccine: Marland Kitchen Difficulty breathing  . Swelling of your face and throat  . A fast heartbeat  . A bad rash all over your body  . Dizziness and weakness    Immunizations Administered    Name Date Dose VIS Date Route   Pfizer COVID-19 Vaccine 10/23/2019 10:16 AM 0.3 mL 08/21/2019 Intramuscular   Manufacturer: [place]   Lot: [id number]   [place]: [id number]

## 2019-11-17 ENCOUNTER — Ambulatory Visit: Payer: Medicare Other | Attending: Internal Medicine

## 2019-11-17 DIAGNOSIS — Z23 Encounter for immunization: Secondary | ICD-10-CM | POA: Insufficient documentation

## 2019-11-17 NOTE — Progress Notes (Signed)
   Covid-19 Vaccination Clinic  Name:  Marvin Coleman    MRN: [id number] DOB: [birth year]  11/17/2019  [name] was observed post Covid-19 immunization for 15 minutes without incident. He was provided with Vaccine Information Sheet and instruction to access the V-Safe system.   [name] was instructed to call 911 with any severe reactions post vaccine: Marland Kitchen Difficulty breathing  . Swelling of face and throat  . A fast heartbeat  . A bad rash all over body  . Dizziness and weakness   Immunizations Administered    Name Date Dose VIS Date Route   Pfizer COVID-19 Vaccine 11/17/2019 11:12 AM 0.3 mL 08/21/2019 Intramuscular   Manufacturer: [place]   Lot: [id number]   [place]: [id number]

## 2022-01-10 ENCOUNTER — Encounter: Payer: Self-pay | Admitting: Gastroenterology

## 2022-01-11 ENCOUNTER — Ambulatory Visit: Payer: Medicare Other | Admitting: Anesthesiology

## 2022-01-11 ENCOUNTER — Other Ambulatory Visit: Payer: Self-pay

## 2022-01-11 ENCOUNTER — Encounter
Admission: RE | Disposition: A | Discharge status: Home / Self Care | Payer: Self-pay | Source: Home / Self Care | Attending: Gastroenterology

## 2022-01-11 ENCOUNTER — Ambulatory Visit
Admission: RE | Admit: 2022-01-11 | Discharge: 2022-01-11 | Disposition: A | Discharge status: Home / Self Care | Payer: Medicare Other | Attending: Gastroenterology | Admitting: Gastroenterology

## 2022-01-11 ENCOUNTER — Encounter: Payer: Self-pay | Admitting: Gastroenterology

## 2022-01-11 DIAGNOSIS — E669 Obesity, unspecified: Secondary | ICD-10-CM | POA: Diagnosis not present

## 2022-01-11 DIAGNOSIS — Z86010 Personal history of colonic polyps: Secondary | ICD-10-CM | POA: Insufficient documentation

## 2022-01-11 DIAGNOSIS — Z1211 Encounter for screening for malignant neoplasm of colon: Secondary | ICD-10-CM | POA: Insufficient documentation

## 2022-01-11 DIAGNOSIS — K640 First degree hemorrhoids: Secondary | ICD-10-CM | POA: Insufficient documentation

## 2022-01-11 DIAGNOSIS — R7303 Prediabetes: Secondary | ICD-10-CM | POA: Diagnosis not present

## 2022-01-11 DIAGNOSIS — Z87891 Personal history of nicotine dependence: Secondary | ICD-10-CM | POA: Insufficient documentation

## 2022-01-11 DIAGNOSIS — Z6836 Body mass index (BMI) 36.0-36.9, adult: Secondary | ICD-10-CM | POA: Insufficient documentation

## 2022-01-11 HISTORY — DX: Vitamin D deficiency, unspecified: E55.9

## 2022-01-11 HISTORY — PX: COLONOSCOPY WITH PROPOFOL: SHX5780

## 2022-01-11 SURGERY — COLONOSCOPY WITH PROPOFOL
Anesthesia: General

## 2022-01-11 MED ORDER — PROPOFOL 500 MG/50ML IV EMUL
INTRAVENOUS | Status: DC | PRN
  Administered 2022-01-11: 140 ug/kg/min via INTRAVENOUS

## 2022-01-11 MED ORDER — PROPOFOL 10 MG/ML IV BOLUS
INTRAVENOUS | Status: DC | PRN
  Administered 2022-01-11: 70 mg via INTRAVENOUS

## 2022-01-11 MED ORDER — PROPOFOL 500 MG/50ML IV EMUL
INTRAVENOUS | Status: AC
  Filled 2022-01-11: qty 100

## 2022-01-11 MED ORDER — SODIUM CHLORIDE 0.9 % IV SOLN: INTRAVENOUS | Status: DC

## 2022-01-11 MED ORDER — LIDOCAINE HCL (CARDIAC) PF 100 MG/5ML IV SOSY
PREFILLED_SYRINGE | INTRAVENOUS | Status: DC | PRN
Start: 2022-01-11 — End: 2022-01-11
  Administered 2022-01-11: 70 mg via INTRAVENOUS

## 2022-01-11 MED ORDER — STERILE WATER FOR IRRIGATION IR SOLN
Status: DC | PRN
  Administered 2022-01-11: 60 mL

## 2022-01-11 NOTE — Anesthesia Preprocedure Evaluation (Addendum)
Anesthesia Evaluation  ?Patient identified by MRN, date of birth, ID band ?Patient awake ? ? ? ?Reviewed: ?Allergy & Precautions, NPO status , Patient's Chart, lab work & pertinent test results ? ?History of Anesthesia Complications ?Negative for: history of anesthetic complications ? ?Airway ?Mallampati: II ? ? ?Neck ROM: Full ? ? ? Dental ? ?(+) Implants ?  ?Pulmonary ?former smoker (quit greater than 30 years ago),  ?  ?Pulmonary exam normal ?breath sounds clear to auscultation ? ? ? ? ? ? Cardiovascular ?Normal cardiovascular exam ?Rhythm:Regular Rate:Normal ? ?Echo 04/18/17:  ?NORMAL LEFT VENTRICULAR SYSTOLIC FUNCTION  ?NORMAL RIGHT VENTRICULAR SYSTOLIC FUNCTION  ?TRIVIAL REGURGITATION NOTED  ?NO VALVULAR STENOSIS  ?Closest EF: >55% (Estimated)  ?Mitral: TRIVIAL MR  ?Tricuspid: TRIVIAL TR  ?  ?Neuro/Psych ?negative neurological ROS ?   ? GI/Hepatic ?negative GI ROS,   ?Endo/Other  ?Prediabetes, obesity ? Renal/GU ?negative Renal ROS  ? ?  ?Musculoskeletal ? ? Abdominal ?  ?Peds ? Hematology ?negative hematology ROS ?(+)   ?Anesthesia Other Findings ? ? Reproductive/Obstetrics ? ?  ? ? ? ? ? ? ? ? ? ? ? ? ? ?  ?  ? ? ? ? ? ? ? ?Anesthesia Physical ?Anesthesia Plan ? ?ASA: 2 ? ?Anesthesia Plan: General  ? ?Post-op Pain Management:   ? ?Induction: Intravenous ? ?PONV Risk Score and Plan: 2 and Propofol infusion, TIVA and Treatment may vary due to age or medical condition ? ?Airway Management Planned: Natural Airway ? ?Additional Equipment:  ? ?Intra-op Plan:  ? ?Post-operative Plan:  ? ?Informed Consent: I have reviewed the patients History and Physical, chart, labs and discussed the procedure including the risks, benefits and alternatives for the proposed anesthesia with the patient or authorized representative who has indicated his/her understanding and acceptance.  ? ? ? ? ? ?Plan Discussed with: CRNA ? ?Anesthesia Plan Comments: (LMA/GETA backup discussed.  Patient consented for  risks of anesthesia including but not limited to:  ?- adverse reactions to medications ?- damage to eyes, teeth, lips or other oral mucosa ?- nerve damage due to positioning  ?- sore throat or hoarseness ?- damage to heart, brain, nerves, lungs, other parts of body or loss of life ? ?Informed patient about role of CRNA in peri- and intra-operative care.  Patient voiced understanding.)  ? ? ? ? ? ? ?Anesthesia Quick Evaluation ? ?

## 2022-01-11 NOTE — Interval H&P Note (Signed)
History and Physical Interval Note: Preprocedure H&P from 01/11/22 ? was reviewed and there was no interval change after seeing and examining the patient.  Written consent was obtained from the patient after discussion of risks, benefits, and alternatives. Patient has consented to proceed with Colonoscopy with possible intervention ? ? ?01/11/2022 ?12:10 PM ? ?Marvin Coleman  has presented today for surgery, with the diagnosis of Z86.010 - History of colon polyps.  The various methods of treatment have been discussed with the patient and family. After consideration of risks, benefits and other options for treatment, the patient has consented to  Procedure(s): ?COLONOSCOPY WITH PROPOFOL (N/A) as a surgical intervention.  The patient's history has been reviewed, patient examined, no change in status, stable for surgery.  I have reviewed the patient's chart and labs.  Questions were answered to the patient's satisfaction.   ? ? ?Marvin Coleman ? ? ?

## 2022-01-11 NOTE — Transfer of Care (Signed)
Immediate Anesthesia Transfer of Care Note ? ?Patient: Marvin Coleman ? ?Procedure(s) Performed: COLONOSCOPY WITH PROPOFOL ? ?Patient Location: PACU ? ?Anesthesia Type:General ? ?Level of Consciousness: awake, alert  and oriented ? ?Airway & Oxygen Therapy: Patient Spontanous Breathing ? ?Post-op Assessment: Report given to RN and Post -op Vital signs reviewed and stable ? ?Post vital signs: Reviewed and stable ? ?Last Vitals:  ?Vitals Value Taken Time  ?BP 138/91 01/11/22 1326  ?Temp 36 ?C 01/11/22 1326  ?Pulse 88 01/11/22 1335  ?Resp 18 01/11/22 1335  ?SpO2 97 % 01/11/22 1335  ?Vitals shown include unvalidated device data. ? ?Last Pain:  ?Vitals:  ? 01/11/22 1326  ?TempSrc: Temporal  ?PainSc: Asleep  ?   ? ?  ? ?Complications: No notable events documented. ?

## 2022-01-11 NOTE — Op Note (Signed)
[facility] ?Gastroenterology ?Patient Name: Marvin Coleman ?Procedure Date: 01/11/2022 12:48 PM ?MRN: [id number] ?Account #: [id number] ?Date of Birth: 03-13-1951 ?Admit Type: Outpatient ?Age: 71 ?Room: [facility] ENDO ROOM 1 ?Gender: Male ?Note Status: Finalized ?Instrument Name: Colonoscope [id number] ?Procedure:             Colonoscopy ?Indications:           High risk colon cancer surveillance: Personal history  ?                       of colonic polyps ?Providers:             Annamaria Helling DO, DO ?Referring MD:          Irven Easterly. Kary Kos, MD (Referring MD) ?Medicines:             Monitored Anesthesia Care ?Complications:         No immediate complications. Estimated blood loss: None. ?Procedure:             Pre-Anesthesia Assessment: ?                       - Prior to the procedure, a History and Physical was  ?                       performed, and patient medications and allergies were  ?                       reviewed. The patient is competent. The risks and  ?                       benefits of the procedure and the sedation options and  ?                       risks were discussed with the patient. All questions  ?                       were answered and informed consent was obtained.  ?                       Patient identification and proposed procedure were  ?                       verified by the physician, the nurse, the anesthetist  ?                       and the technician in the endoscopy suite. Mental  ?                       Status Examination: alert and oriented. Airway  ?                       Examination: normal oropharyngeal airway and neck  ?                       mobility. Respiratory Examination: clear to  ?                       auscultation. CV Examination: RRR, no murmurs, no S3  ?  or S4. Prophylactic Antibiotics: The patient does not  ?                       require prophylactic antibiotics. Prior  ?                       Anticoagulants: The patient  has taken no previous  ?                       anticoagulant or antiplatelet agents. ASA Grade  ?                       Assessment: II - A patient with mild systemic disease.  ?                       After reviewing the risks and benefits, the patient  ?                       was deemed in satisfactory condition to undergo the  ?                       procedure. The anesthesia plan was to use monitored  ?                       anesthesia care (MAC). Immediately prior to  ?                       administration of medications, the patient was  ?                       re-assessed for adequacy to receive sedatives. The  ?                       heart rate, respiratory rate, oxygen saturations,  ?                       blood pressure, adequacy of pulmonary ventilation, and  ?                       response to care were monitored throughout the  ?                       procedure. The physical status of the patient was  ?                       re-assessed after the procedure. ?                       After obtaining informed consent, the colonoscope was  ?                       passed under direct vision. Throughout the procedure,  ?                       the patient's blood pressure, pulse, and oxygen  ?                       saturations were monitored continuously. The  ?  Colonoscope was introduced through the anus and  ?                       advanced to the the cecum, identified by appendiceal  ?                       orifice and ileocecal valve. The colonoscopy was  ?                       performed without difficulty. The patient tolerated  ?                       the procedure well. The quality of the bowel  ?                       preparation was evaluated using the BBPS [facility] Bowel  ?                       Preparation Scale) with scores of: Right Colon = 1  ?                       (portion of mucosa seen, but other areas not well seen  ?                       due to staining, residual stool  and/or opaque liquid),  ?                       Transverse Colon = 2 (minor amount of residual  ?                       staining, small fragments of stool and/or opaque  ?                       liquid, but mucosa seen well) and Left Colon = 2  ?                       (minor amount of residual staining, small fragments of  ?                       stool and/or opaque liquid, but mucosa seen well). The  ?                       total BBPS score equals 5. The quality of the bowel  ?                       preparation was fair. The ileocecal valve, appendiceal  ?                       orifice, and rectum were photographed. ?Findings: ?     The perianal and digital rectal examinations were normal. Pertinent  ?     negatives include normal sphincter tone. ?     Extensive amounts of thick, liquid stool was found in the entire colon,  ?     stickign to the mucosa and interfering with visualization. Lavage of the  ?     area was performed using a large amount of sterile water, resulting in  ?  clearance with fair visualization. Estimated blood loss: none. ?     Non-bleeding internal hemorrhoids were found during retroflexion. The  ?     hemorrhoids were Grade I (internal hemorrhoids that do not prolapse).  ?     Estimated blood loss: none. ?     The exam was otherwise without abnormality on direct and retroflexion  ?     views. ?     No large lesions, however, cannot rule out small to medium sized lesions  ?     given limited prep. Exam was otherwsie without abnormality. ?Impression:            - Preparation of the colon was fair. ?                       - Stool in the entire examined colon. ?                       - Non-bleeding internal hemorrhoids. ?                       - The examination was otherwise normal on direct and  ?                       retroflexion views. ?                       - No specimens collected. ?Recommendation:        - Discharge patient to home. ?                       - Resume previous diet. ?                        - Continue present medications. ?                       - Repeat colonoscopy in 1 year because the bowel  ?                       preparation was suboptimal. ?                       - Return to referring physician as previously  ?                       scheduled. ?                       - The findings and recommendations were discussed with  ?                       the patient. ?Procedure Code(s):     --- Professional --- ?                       [phone], Colonoscopy, flexible; diagnostic, including  ?                       collection of specimen(s) by brushing or washing, when  ?                       performed (separate procedure) ?Diagnosis Code(s):     ---  Professional --- ?                       Z86.010, Personal history of colonic polyps ?                       K64.0, First degree hemorrhoids ?CPT copyright 2019 [employer]. All rights reserved. ?The codes documented in this report are preliminary and upon coder review may  ?be revised to meet current compliance requirements. ?Attending Participation: ?     I personally performed the entire procedure. ?Volney American, DO ?Annamaria Helling DO, DO ?01/11/2022 1:30:21 PM ?This report has been signed electronically. ?Number of Addenda: 0 ?Note Initiated On: 01/11/2022 12:48 PM ?Scope Withdrawal Time: 0 hours 11 minutes 29 seconds  ?Total Procedure Duration: 0 hours 23 minutes 33 seconds  ?Estimated Blood Loss:  Estimated blood loss: none. ?     [facility] ?

## 2022-01-11 NOTE — Anesthesia Postprocedure Evaluation (Signed)
Anesthesia Post Note ? ?Patient: Marvin Coleman ? ?Procedure(s) Performed: COLONOSCOPY WITH PROPOFOL ? ?Patient location during evaluation: PACU ?Anesthesia Type: General ?Level of consciousness: awake and alert, oriented and patient cooperative ?Pain management: pain level controlled ?Vital Signs Assessment: post-procedure vital signs reviewed and stable ?Respiratory status: spontaneous breathing, nonlabored ventilation and respiratory function stable ?Cardiovascular status: blood pressure returned to baseline and stable ?Postop Assessment: adequate PO intake ?Anesthetic complications: no ? ? ?No notable events documented. ? ? ?Last Vitals:  ?Vitals:  ? 01/11/22 1336 01/11/22 1346  ?BP: 123/79 (!) 145/91  ?Pulse: 88 79  ?Resp: 18 19  ?Temp:    ?SpO2: 97% 98%  ?  ?Last Pain:  ?Vitals:  ? 01/11/22 1346  ?TempSrc:   ?PainSc: 0-No pain  ? ? ?  ?  ?  ?  ?  ?  ? ?Darrin Nipper ? ? ? ? ?

## 2022-01-11 NOTE — H&P (Signed)
? ?Pre-Procedure H&P ?  ?Patient ID: Marvin Coleman is a 71 y.o. male. ? ?Gastroenterology Provider: Annamaria Helling, DO ? ?Referring Provider: Laurine Blazer, PA ?PCP: [place] Pink, MD ? ?Date: 01/11/2022 ? ?HPI ?Marvin Coleman is a 71 y.o. male who presents today for Colonoscopy for surveillance-personal history of colon polyps. ?Patient with daily bowel movement without melena hematochezia constipation or abdominal pain.  Occasional diarrhea but does not occur frequently ?Patient underwent colonoscopy in April 2018 demonstrating 1 tubular adenoma left-sided diverticulosis. ?Previous colonoscopies have also demonstrated adenomatous polyps.  Underwent colonoscopy in 2012 2009 and 2006 ? ?No family history of colon cancer or colon polyps.  Father with history of pancreatic cancer ?Previous tobacco use ?Most recent lab work hemoglobin 14.5 MCV 90.8 platelets 190,000 creatinine 1.0 A1c 6.0 ? ?Past Medical History:  ?Diagnosis Date  ? Hyperlipemia   ? Umbilical hernia   ? Vitamin D deficiency   ? ? ?Past Surgical History:  ?Procedure Laterality Date  ? COLONOSCOPY N/A 12/11/2016  ? Procedure: COLONOSCOPY;  Surgeon: Lollie Sails, MD;  Location: [facility] ENDOSCOPY;  Service: Endoscopy;  Laterality: N/A;  ? HERNIA REPAIR    ? TONSILLECTOMY    ? ? ?Family History ?Father with history of pancreatic cancer ?No other h/o GI disease or malignancy ? ?Review of Systems  ?Constitutional:  Negative for activity change, appetite change, chills, diaphoresis, fatigue, fever and unexpected weight change.  ?HENT:  Negative for trouble swallowing and voice change.   ?Respiratory:  Negative for shortness of breath and wheezing.   ?Cardiovascular:  Negative for chest pain, palpitations and leg swelling.  ?Gastrointestinal:  Positive for diarrhea (Occasional). Negative for abdominal distention, abdominal pain, anal bleeding, blood in stool, constipation, nausea and vomiting.  ?Musculoskeletal:  Negative for arthralgias and  myalgias.  ?Skin:  Negative for color change and pallor.  ?Neurological:  Negative for dizziness, syncope and weakness.  ?Psychiatric/Behavioral:  Negative for confusion. The patient is not nervous/anxious.   ?All other systems reviewed and are negative.  ? ?Medications ?No current facility-administered medications on file prior to encounter.  ? ?Current Outpatient Medications on File Prior to Encounter  ?Medication Sig Dispense Refill  ? co-enzyme Q-10 30 MG capsule Take 100 mg by mouth daily.    ? aspirin EC 81 MG tablet Take 81 mg by mouth daily.    ? Cholecalciferol 25 MCG (1000 UT) capsule Take 1,000 Units by mouth daily.    ? furosemide (LASIX) 20 MG tablet TK 1 T PO QD PRN FOR EDEMA (Patient not taking: Reported on 01/11/2022)  0  ? Multiple Vitamin (MULTIVITAMIN) capsule Take by mouth.    ? pravastatin (PRAVACHOL) 10 MG tablet Take 10 mg by mouth daily.    ? ? ?Pertinent medications related to GI and procedure were reviewed by me with the patient prior to the procedure ? ? ?Current Facility-Administered Medications:  ?  0.9 %  sodium chloride infusion, , Intravenous, Continuous, Annamaria Helling, DO, Last Rate: 20 mL/hr at 01/11/22 1106, New Bag at 01/11/22 1106 ?  ?  ? ?No Known Allergies ?Allergies were reviewed by me prior to the procedure ? ?Objective  ? ?Body mass index is 36.49 kg/m?. ?Vitals:  ? 01/11/22 1053  ?BP: (!) 144/92  ?Pulse: 80  ?Resp: 18  ?Temp: (!) 97.5 ?F (36.4 ?C)  ?TempSrc: Temporal  ?SpO2: 96%  ?Weight: 108.9 kg  ?Height: '5\' 8"'$  (1.727 m)  ? ? ? ?Physical Exam ?Vitals and nursing note reviewed.  ?  Constitutional:   ?   General: He is not in acute distress. ?   Appearance: Normal appearance. He is not ill-appearing, toxic-appearing or diaphoretic.  ?HENT:  ?   Head: Normocephalic and atraumatic.  ?   Nose: Nose normal.  ?   Mouth/Throat:  ?   Mouth: Mucous membranes are moist.  ?   Pharynx: Oropharynx is clear.  ?Eyes:  ?   General: No scleral icterus. ?   Extraocular Movements:  Extraocular movements intact.  ?Cardiovascular:  ?   Rate and Rhythm: Normal rate and regular rhythm.  ?   Heart sounds: Normal heart sounds. No murmur heard. ?  No friction rub. No gallop.  ?Pulmonary:  ?   Effort: Pulmonary effort is normal. No respiratory distress.  ?   Breath sounds: Normal breath sounds. No wheezing, rhonchi or rales.  ?Abdominal:  ?   General: Bowel sounds are normal. There is no distension.  ?   Palpations: Abdomen is soft.  ?   Tenderness: There is no abdominal tenderness. There is no guarding or rebound.  ?Musculoskeletal:  ?   Cervical back: Neck supple.  ?   Right lower leg: No edema.  ?   Left lower leg: No edema.  ?Skin: ?   General: Skin is warm and dry.  ?   Coloration: Skin is not jaundiced or pale.  ?Neurological:  ?   General: No focal deficit present.  ?   Mental Status: He is alert and oriented to person, place, and time. Mental status is at baseline.  ?Psychiatric:     ?   Mood and Affect: Mood normal.     ?   Behavior: Behavior normal.     ?   Thought Content: Thought content normal.     ?   Judgment: Judgment normal.  ? ? ? ?Assessment:  ?Marvin Coleman is a 71 y.o. male  who presents today for Colonoscopy for Surveillance-history of colon polyps. ? ?Plan:  ?Colonoscopy with possible intervention today ? ?Colonoscopy with possible biopsy, control of bleeding, polypectomy, and interventions as necessary has been discussed with the patient/patient representative. Informed consent was obtained from the patient/patient representative after explaining the indication, nature, and risks of the procedure including but not limited to death, bleeding, perforation, missed neoplasm/lesions, cardiorespiratory compromise, and reaction to medications. Opportunity for questions was given and appropriate answers were provided. Patient/patient representative has verbalized understanding is amenable to undergoing the procedure. ? ? ?Annamaria Helling, DO  ?[facility]  Gastroenterology ? ?Portions of the record may have been created with voice recognition software. Occasional wrong-word or 'sound-a-like' substitutions may have occurred due to the inherent limitations of voice recognition software.  Read the chart carefully and recognize, using context, where substitutions may have occurred. ?

## 2022-01-12 ENCOUNTER — Encounter: Payer: Self-pay | Admitting: Gastroenterology

## 2023-01-23 ENCOUNTER — Encounter: Payer: Self-pay | Admitting: Gastroenterology

## 2023-01-23 NOTE — H&P (Signed)
Pre-Procedure H&P   Patient ID: Marvin Coleman is a 72 y.o. male.  Gastroenterology Provider: Jaynie Collins, DO  Referring Provider: Tawni Pummel, PA PCP: Jerl Mina, MD  Date: 01/24/2023  HPI Marvin Coleman is a 72 y.o. male who presents today for Colonoscopy for Surveillance-personal history of colon polyps .  Patient undergoing colonoscopy for personal history of polyps.  Last underwent completed colonoscopy in April 2018.  Left-sided diverticulosis and 1 tubular adenomatous polyp was noted.  He underwent repeat colonoscopy in May 2023 which was completed, however, poor prep throughout the colon.  Internal hemorrhoids were also noted.  He was recommended 1 year follow-up.  Other colonoscopies include December 2012, October 2009, July 2006  Father with history of pancreatic cancer  Creatinine 1.0 hemoglobin 15.5 MCV 89 platelets 274,000   Past Medical History:  Diagnosis Date   Hyperlipemia    Umbilical hernia    Vitamin D deficiency     Past Surgical History:  Procedure Laterality Date   COLONOSCOPY N/A 12/11/2016   Procedure: COLONOSCOPY;  Surgeon: Christena Deem, MD;  Location: [facility] ENDOSCOPY;  Service: Endoscopy;  Laterality: N/A;   COLONOSCOPY WITH PROPOFOL N/A 01/11/2022   Procedure: COLONOSCOPY WITH PROPOFOL;  Surgeon: Jaynie Collins, DO;  Location: [facility] ENDOSCOPY;  Service: Gastroenterology;  Laterality: N/A;   HERNIA REPAIR     TONSILLECTOMY      Family History Father-pancreatic cancer No other h/o GI disease or malignancy  Review of Systems  Constitutional:  Negative for activity change, appetite change, chills, diaphoresis, fatigue, fever and unexpected weight change.  HENT:  Negative for trouble swallowing and voice change.   Respiratory:  Negative for shortness of breath and wheezing.   Cardiovascular:  Negative for chest pain, palpitations and leg swelling.  Gastrointestinal:  Negative for abdominal distention, abdominal  pain, anal bleeding, blood in stool, constipation, diarrhea, nausea and vomiting.  Musculoskeletal:  Negative for arthralgias and myalgias.  Skin:  Negative for color change and pallor.  Neurological:  Negative for dizziness, syncope and weakness.  Psychiatric/Behavioral:  Negative for confusion. The patient is not nervous/anxious.   All other systems reviewed and are negative.    Medications No current facility-administered medications on file prior to encounter.   Current Outpatient Medications on File Prior to Encounter  Medication Sig Dispense Refill   aspirin EC 81 MG tablet Take 81 mg by mouth daily.     Cholecalciferol 25 MCG (1000 UT) capsule Take 1,000 Units by mouth daily.     co-enzyme Q-10 30 MG capsule Take 100 mg by mouth daily.     furosemide (LASIX) 20 MG tablet TK 1 T PO QD PRN FOR EDEMA (Patient not taking: Reported on 01/11/2022)  0   Multiple Vitamin (MULTIVITAMIN) capsule Take by mouth.     pravastatin (PRAVACHOL) 10 MG tablet Take 10 mg by mouth daily.      Pertinent medications related to GI and procedure were reviewed by me with the patient prior to the procedure   Current Facility-Administered Medications:    0.9 %  sodium chloride infusion, , Intravenous, Continuous, Jaynie Collins, DO, Last Rate: 20 mL/hr at 01/24/23 0830, New Bag at 01/24/23 0830  sodium chloride 20 mL/hr at 01/24/23 0830       No Known Allergies Allergies were reviewed by me prior to the procedure  Objective   Body mass index is 35.88 kg/m. Vitals:   01/24/23 0814  BP: (!) 143/94  Pulse: (!) 58  Resp: 18  Temp: (!) 96.5 F (35.8 C)  TempSrc: Temporal  SpO2: 97%  Weight: 107 kg  Height: 5\' 8"  (1.727 m)     Physical Exam Vitals and nursing note reviewed.  Constitutional:      General: He is not in acute distress.    Appearance: Normal appearance. He is not ill-appearing, toxic-appearing or diaphoretic.  HENT:     Head: Normocephalic and atraumatic.     Nose:  Nose normal.     Mouth/Throat:     Mouth: Mucous membranes are moist.     Pharynx: Oropharynx is clear.  Eyes:     General: No scleral icterus.    Extraocular Movements: Extraocular movements intact.  Cardiovascular:     Rate and Rhythm: Regular rhythm. Bradycardia present.     Heart sounds: Normal heart sounds. No murmur heard.    No friction rub. No gallop.  Pulmonary:     Effort: Pulmonary effort is normal. No respiratory distress.     Breath sounds: Normal breath sounds. No wheezing, rhonchi or rales.  Abdominal:     General: Bowel sounds are normal. There is no distension.     Palpations: Abdomen is soft.     Tenderness: There is no abdominal tenderness. There is no guarding or rebound.  Musculoskeletal:     Cervical back: Neck supple.     Right lower leg: No edema.     Left lower leg: No edema.  Skin:    General: Skin is warm and dry.     Coloration: Skin is not jaundiced or pale.  Neurological:     General: No focal deficit present.     Mental Status: He is alert and oriented to person, place, and time. Mental status is at baseline.  Psychiatric:        Mood and Affect: Mood normal.        Behavior: Behavior normal.        Thought Content: Thought content normal.        Judgment: Judgment normal.      Assessment:  Marvin Coleman is a 72 y.o. male  who presents today for Colonoscopy for Surveillance-personal history of colon polyps .  Plan:  Colonoscopy with possible intervention today  Colonoscopy with possible biopsy, control of bleeding, polypectomy, and interventions as necessary has been discussed with the patient/patient representative. Informed consent was obtained from the patient/patient representative after explaining the indication, nature, and risks of the procedure including but not limited to death, bleeding, perforation, missed neoplasm/lesions, cardiorespiratory compromise, and reaction to medications. Opportunity for questions was given and  appropriate answers were provided. Patient/patient representative has verbalized understanding is amenable to undergoing the procedure.   Jaynie Collins, DO  [facility] Gastroenterology  Portions of the record may have been created with voice recognition software. Occasional wrong-word or 'sound-a-like' substitutions may have occurred due to the inherent limitations of voice recognition software.  Read the chart carefully and recognize, using context, where substitutions may have occurred.

## 2023-01-24 ENCOUNTER — Ambulatory Visit: Payer: Medicare Other | Admitting: Certified Registered Nurse Anesthetist

## 2023-01-24 ENCOUNTER — Encounter: Payer: Self-pay | Admitting: Gastroenterology

## 2023-01-24 ENCOUNTER — Ambulatory Visit
Admission: RE | Admit: 2023-01-24 | Discharge: 2023-01-24 | Disposition: A | Discharge status: Home / Self Care | Payer: Medicare Other | Attending: Gastroenterology | Admitting: Gastroenterology

## 2023-01-24 ENCOUNTER — Encounter
Admission: RE | Disposition: A | Discharge status: Home / Self Care | Payer: Self-pay | Source: Home / Self Care | Attending: Gastroenterology

## 2023-01-24 DIAGNOSIS — Z1211 Encounter for screening for malignant neoplasm of colon: Secondary | ICD-10-CM | POA: Insufficient documentation

## 2023-01-24 DIAGNOSIS — Z86010 Personal history of colonic polyps: Secondary | ICD-10-CM | POA: Insufficient documentation

## 2023-01-24 DIAGNOSIS — E559 Vitamin D deficiency, unspecified: Secondary | ICD-10-CM | POA: Diagnosis not present

## 2023-01-24 DIAGNOSIS — Z09 Encounter for follow-up examination after completed treatment for conditions other than malignant neoplasm: Secondary | ICD-10-CM | POA: Insufficient documentation

## 2023-01-24 DIAGNOSIS — K5730 Diverticulosis of large intestine without perforation or abscess without bleeding: Secondary | ICD-10-CM | POA: Insufficient documentation

## 2023-01-24 DIAGNOSIS — E785 Hyperlipidemia, unspecified: Secondary | ICD-10-CM | POA: Diagnosis not present

## 2023-01-24 DIAGNOSIS — Z87891 Personal history of nicotine dependence: Secondary | ICD-10-CM | POA: Insufficient documentation

## 2023-01-24 DIAGNOSIS — D120 Benign neoplasm of cecum: Secondary | ICD-10-CM | POA: Diagnosis not present

## 2023-01-24 DIAGNOSIS — D123 Benign neoplasm of transverse colon: Secondary | ICD-10-CM | POA: Insufficient documentation

## 2023-01-24 DIAGNOSIS — K640 First degree hemorrhoids: Secondary | ICD-10-CM | POA: Insufficient documentation

## 2023-01-24 DIAGNOSIS — Z800 Family history of malignant neoplasm of digestive organs: Secondary | ICD-10-CM | POA: Diagnosis not present

## 2023-01-24 HISTORY — PX: COLONOSCOPY: SHX5424

## 2023-01-24 SURGERY — COLONOSCOPY
Anesthesia: General

## 2023-01-24 MED ORDER — LIDOCAINE HCL (PF) 2 % IJ SOLN
INTRAMUSCULAR | Status: AC
  Filled 2023-01-24: qty 5

## 2023-01-24 MED ORDER — PROPOFOL 1000 MG/100ML IV EMUL
INTRAVENOUS | Status: AC
  Filled 2023-01-24: qty 100

## 2023-01-24 MED ORDER — PROPOFOL 500 MG/50ML IV EMUL
INTRAVENOUS | Status: DC | PRN
  Administered 2023-01-24: 150 ug/kg/min via INTRAVENOUS

## 2023-01-24 MED ORDER — SODIUM CHLORIDE 0.9 % IV SOLN: INTRAVENOUS | Status: DC

## 2023-01-24 MED ORDER — GLYCOPYRROLATE 0.2 MG/ML IJ SOLN
INTRAMUSCULAR | Status: AC
  Filled 2023-01-24: qty 1

## 2023-01-24 MED ORDER — LIDOCAINE HCL (CARDIAC) PF 100 MG/5ML IV SOSY
PREFILLED_SYRINGE | INTRAVENOUS | Status: DC | PRN
  Administered 2023-01-24: 50 mg via INTRAVENOUS

## 2023-01-24 MED ORDER — PROPOFOL 10 MG/ML IV BOLUS
INTRAVENOUS | Status: DC | PRN
  Administered 2023-01-24: 50 mg via INTRAVENOUS
  Administered 2023-01-24: 60 mg via INTRAVENOUS

## 2023-01-24 NOTE — Transfer of Care (Signed)
Immediate Anesthesia Transfer of Care Note  Patient: Marvin Coleman  Procedure(s) Performed: COLONOSCOPY  Patient Location: Endoscopy Unit  Anesthesia Type:General  Level of Consciousness: drowsy  Airway & Oxygen Therapy: Patient Spontanous Breathing  Post-op Assessment: Report given to RN and Post -op Vital signs reviewed and stable  Post vital signs: Reviewed and stable  Last Vitals:  Vitals Value Taken Time  BP 111/71 01/24/23 0919  Temp    Pulse 87 01/24/23 0920  Resp 25 01/24/23 0920  SpO2 97 % 01/24/23 0920  Vitals shown include unvalidated device data.  Last Pain:  Vitals:   01/24/23 0814  TempSrc: Temporal         Complications: No notable events documented.

## 2023-01-24 NOTE — Interval H&P Note (Signed)
History and Physical Interval Note: Preprocedure H&P from 01/24/23  was reviewed and there was no interval change after seeing and examining the patient.  Written consent was obtained from the patient after discussion of risks, benefits, and alternatives. Patient has consented to proceed with Colonoscopy with possible intervention   01/24/2023 8:35 AM  Marvin Coleman  has presented today for surgery, with the diagnosis of History of colon polyps (Z86.010).  The various methods of treatment have been discussed with the patient and family. After consideration of risks, benefits and other options for treatment, the patient has consented to  Procedure(s): COLONOSCOPY (N/A) as a surgical intervention.  The patient's history has been reviewed, patient examined, no change in status, stable for surgery.  I have reviewed the patient's chart and labs.  Questions were answered to the patient's satisfaction.     Jaynie Collins

## 2023-01-24 NOTE — Op Note (Signed)
[facility] Gastroenterology Patient Name: Marvin Coleman Procedure Date: 01/24/2023 8:34 AM MRN: [id number] Account #: [id number] Date of Birth: May 24, 1951 Admit Type: Outpatient Age: 72 Room: [facility] ENDO ROOM 1 Gender: Male Note Status: Finalized Instrument Name: Colonoscope [id number] Procedure:             Colonoscopy Indications:           High risk colon cancer surveillance: Personal history                         of colonic polyps Providers:             Trenda Moots, DO Referring MD:          Rhona Leavens. Burnett Sheng, MD (Referring MD) Medicines:             Monitored Anesthesia Care Complications:         No immediate complications. Estimated blood loss:                         Minimal. Procedure:             Pre-Anesthesia Assessment:                        - Prior to the procedure, a History and Physical was                         performed, and patient medications and allergies were                         reviewed. The patient is competent. The risks and                         benefits of the procedure and the sedation options and                         risks were discussed with the patient. All questions                         were answered and informed consent was obtained.                         Patient identification and proposed procedure were                         verified by the physician, the nurse, the anesthetist                         and the technician in the endoscopy suite. Mental                         Status Examination: alert and oriented. Airway                         Examination: normal oropharyngeal airway and neck                         mobility. Respiratory Examination: clear to  auscultation. CV Examination: RRR, no murmurs, no S3                         or S4. Prophylactic Antibiotics: The patient does not                         require prophylactic antibiotics. Prior                          Anticoagulants: The patient has taken no anticoagulant                         or antiplatelet agents. ASA Grade Assessment: III - A                         patient with severe systemic disease. After reviewing                         the risks and benefits, the patient was deemed in                         satisfactory condition to undergo the procedure. The                         anesthesia plan was to use monitored anesthesia care                         (MAC). Immediately prior to administration of                         medications, the patient was re-assessed for adequacy                         to receive sedatives. The heart rate, respiratory                         rate, oxygen saturations, blood pressure, adequacy of                         pulmonary ventilation, and response to care were                         monitored throughout the procedure. The physical                         status of the patient was re-assessed after the                         procedure.                        After obtaining informed consent, the colonoscope was                         passed under direct vision. Throughout the procedure,                         the patient's blood pressure, pulse, and oxygen  saturations were monitored continuously. The                         Colonoscope was introduced through the anus and                         advanced to the the cecum, identified by appendiceal                         orifice and ileocecal valve. The colonoscopy was                         performed without difficulty. The patient tolerated                         the procedure well. The quality of the bowel                         preparation was evaluated using the BBPS [facility] Bowel                         Preparation Scale) with scores of: Right Colon = 2                         (minor amount of residual staining, small fragments of                         stool  and/or opaque liquid, but mucosa seen well),                         Transverse Colon = 2 (minor amount of residual                         staining, small fragments of stool and/or opaque                         liquid, but mucosa seen well) and Left Colon = 2                         (minor amount of residual staining, small fragments of                         stool and/or opaque liquid, but mucosa seen well). The                         total BBPS score equals 6. The quality of the bowel                         preparation was good. The ileocecal valve, appendiceal                         orifice, and rectum were photographed. Findings:      The perianal and digital rectal examinations were normal. Pertinent       negatives include normal sphincter tone.      Scattered small-mouthed diverticula were found in the recto-sigmoid       colon and sigmoid colon. Estimated blood loss: none.  Bleeding internal hemorrhoids were found during retroflexion. The       hemorrhoids were Grade I (internal hemorrhoids that do not prolapse).       Estimated blood loss: none.      Two sessile polyps were found in the transverse colon and cecum. The       polyps were 1 to 2 mm in size. These polyps were removed with a jumbo       cold forceps. Resection and retrieval were complete. Estimated blood       loss was minimal.      Two sessile polyps were found in the transverse colon and cecum. The       polyps were 3 to 5 mm in size. These polyps were removed with a cold       snare. Resection and retrieval were complete. Estimated blood loss was       minimal.      The exam was otherwise without abnormality on direct and retroflexion       views. Impression:            - Diverticulosis in the recto-sigmoid colon and in the                         sigmoid colon.                        - Bleeding internal hemorrhoids.                        - Two 1 to 2 mm polyps in the transverse colon and in                          the cecum, removed with a jumbo cold forceps. Resected                         and retrieved.                        - Two 3 to 5 mm polyps in the transverse colon and in                         the cecum, removed with a cold snare. Resected and                         retrieved.                        - The examination was otherwise normal on direct and                         retroflexion views. Recommendation:        - Patient has a contact number available for                         emergencies. The signs and symptoms of potential                         delayed complications were discussed with the patient.  Return to normal activities tomorrow. Written                         discharge instructions were provided to the patient.                        - Discharge patient to home.                        - Resume previous diet.                        - Continue present medications.                        - No ibuprofen, naproxen, or other non-steroidal                         anti-inflammatory drugs for 5 days after polyp removal.                        - Await pathology results.                        - Repeat colonoscopy for surveillance based on                         pathology results.                        - Return to referring physician as previously                         scheduled.                        - The findings and recommendations were discussed with                         the patient. Procedure Code(s):     --- Professional ---                        [id number], Colonoscopy, flexible; with removal of                         tumor(s), polyp(s), or other lesion(s) by snare                         technique                        45380, 59, Colonoscopy, flexible; with biopsy, single                         or multiple Diagnosis Code(s):     --- Professional ---                        Z86.010, Personal history of colonic polyps                         K64.0, First degree hemorrhoids  D12.3, Benign neoplasm of transverse colon (hepatic                         flexure or splenic flexure)                        D12.0, Benign neoplasm of cecum                        K57.30, Diverticulosis of large intestine without                         perforation or abscess without bleeding CPT copyright 2022 American Medical Association. All rights reserved. The codes documented in this report are preliminary and upon coder review may  be revised to meet current compliance requirements. Attending Participation:      I personally performed the entire procedure. Elfredia Nevins, DO Jaynie Collins DO, DO 01/24/2023 9:21:22 AM This report has been signed electronically. Number of Addenda: 0 Note Initiated On: 01/24/2023 8:34 AM Scope Withdrawal Time: 0 hours 21 minutes 41 seconds  Total Procedure Duration: 0 hours 30 minutes 27 seconds  Estimated Blood Loss:  Estimated blood loss was minimal.      [facility]

## 2023-01-24 NOTE — Anesthesia Procedure Notes (Signed)
Procedure Name: MAC Date/Time: 01/24/2023 8:37 AM  Performed by: Hezzie Bump, CRNAPre-anesthesia Checklist: Patient identified, Emergency Drugs available, Suction available and Patient being monitored Patient Re-evaluated:Patient Re-evaluated prior to induction Oxygen Delivery Method: Nasal cannula Induction Type: IV induction Placement Confirmation: positive ETCO2

## 2023-01-24 NOTE — Anesthesia Postprocedure Evaluation (Signed)
Anesthesia Post Note  Patient: Marvin Coleman  Procedure(s) Performed: COLONOSCOPY  Patient location during evaluation: Endoscopy Anesthesia Type: General Level of consciousness: awake and alert Pain management: pain level controlled Vital Signs Assessment: post-procedure vital signs reviewed and stable Respiratory status: spontaneous breathing, nonlabored ventilation, respiratory function stable and patient connected to nasal cannula oxygen Cardiovascular status: blood pressure returned to baseline and stable Postop Assessment: no apparent nausea or vomiting Anesthetic complications: no   No notable events documented.   Last Vitals:  Vitals:   01/24/23 0920 01/24/23 0940  BP:  (!) 162/94  Pulse:    Resp:    Temp: (!) 35.9 C   SpO2:      Last Pain:  Vitals:   01/24/23 0940  TempSrc:   PainSc: 0-No pain                 Cleda Mccreedy Ceria Suminski

## 2023-01-24 NOTE — Anesthesia Preprocedure Evaluation (Signed)
Anesthesia Evaluation  Patient identified by MRN, date of birth, ID band Patient awake    Reviewed: Allergy & Precautions, NPO status , Patient's Chart, lab work & pertinent test results  History of Anesthesia Complications Negative for: history of anesthetic complications  Airway Mallampati: III  TM Distance: <3 FB Neck ROM: full    Dental  (+) Chipped, Poor Dentition   Pulmonary neg shortness of breath, sleep apnea , former smoker   Pulmonary exam normal        Cardiovascular Exercise Tolerance: Good (-) angina (-) Past MI Normal cardiovascular exam     Neuro/Psych negative neurological ROS  negative psych ROS   GI/Hepatic negative GI ROS, Neg liver ROS,neg GERD  ,,  Endo/Other  negative endocrine ROS    Renal/GU negative Renal ROS  negative genitourinary   Musculoskeletal   Abdominal   Peds  Hematology negative hematology ROS (+)   Anesthesia Other Findings Past Medical History: No date: Hyperlipemia No date: Umbilical hernia No date: Vitamin D deficiency  Past Surgical History: 12/11/2016: COLONOSCOPY; N/A     Comment:  Procedure: COLONOSCOPY;  Surgeon: Christena Deem, MD;              Location: [facility] ENDOSCOPY;  Service: Endoscopy;                Laterality: N/A; 01/11/2022: COLONOSCOPY WITH PROPOFOL; N/A     Comment:  Procedure: COLONOSCOPY WITH PROPOFOL;  Surgeon: Jaynie Collins, DO;  Location: [facility] ENDOSCOPY;  Service:               Gastroenterology;  Laterality: N/A; No date: HERNIA REPAIR No date: TONSILLECTOMY  BMI    Body Mass Index: 35.88 kg/m      Reproductive/Obstetrics negative OB ROS                             Anesthesia Physical Anesthesia Plan  ASA: 3  Anesthesia Plan: General   Post-op Pain Management:    Induction: Intravenous  PONV Risk Score and Plan: Propofol infusion and TIVA  Airway Management Planned: Natural Airway  and Nasal Cannula  Additional Equipment:   Intra-op Plan:   Post-operative Plan:   Informed Consent: I have reviewed the patients History and Physical, chart, labs and discussed the procedure including the risks, benefits and alternatives for the proposed anesthesia with the patient or authorized representative who has indicated his/her understanding and acceptance.     Dental Advisory Given  Plan Discussed with: Anesthesiologist, CRNA and Surgeon  Anesthesia Plan Comments: (Patient consented for risks of anesthesia including but not limited to:  - adverse reactions to medications - risk of airway placement if required - damage to eyes, teeth, lips or other oral mucosa - nerve damage due to positioning  - sore throat or hoarseness - Damage to heart, brain, nerves, lungs, other parts of body or loss of life  Patient voiced understanding.)       Anesthesia Quick Evaluation

## 2023-01-25 ENCOUNTER — Encounter: Payer: Self-pay | Admitting: Gastroenterology

## 2023-01-25 LAB — SURGICAL PATHOLOGY

## 2023-06-25 ENCOUNTER — Emergency Department: Payer: Medicare Other

## 2023-06-25 ENCOUNTER — Emergency Department
Admission: EM | Admit: 2023-06-25 | Discharge: 2023-06-26 | Disposition: A | Discharge status: Home / Self Care | Payer: Medicare Other | Attending: Emergency Medicine | Admitting: Emergency Medicine

## 2023-06-25 ENCOUNTER — Other Ambulatory Visit: Payer: Self-pay

## 2023-06-25 DIAGNOSIS — M79605 Pain in left leg: Secondary | ICD-10-CM | POA: Diagnosis present

## 2023-06-25 NOTE — ED Provider Notes (Signed)
[facility] Provider Note    Event Date/Time   First MD Initiated Contact with Patient 06/25/23 2310     (approximate)   History   Leg Pain   HPI  Marvin Coleman is a 72 y.o. male who presents to the ED for evaluation of Leg Pain   Review routine annual PCP visit from 11 months ago.  Obese patient with history of HLD  Patient presents to the ED for evaluation of a DVT.  He reports 3 occurrences of left calf pain in the past 2 weeks.  Reports awakening with the pain 1 morning 2 weeks ago, pain improving after he got moving in the daytime and did not recur until 4 days ago.  Similar pain in the calf upon awakening but improved with ambulation during the daytime.  Third episode of pain was today and reports will slightly more proximal, to the popliteal fossa, and he reports concern that the pain may be due to a DVT due to the proximal migration.  Denies any falls, injuries, fevers, swelling, skin changes or other concerns.   Physical Exam   Triage Vital Signs: ED Triage Vitals  Encounter Vitals Group     BP 06/25/23 2041 (!) 161/98     Systolic BP Percentile --      Diastolic BP Percentile --      Pulse Rate 06/25/23 2041 80     Resp 06/25/23 2041 18     Temp 06/25/23 2041 98.3 F (36.8 C)     Temp Source 06/25/23 2041 Oral     SpO2 06/25/23 2041 97 %     Weight 06/25/23 2042 240 lb (108.9 kg)     Height 06/25/23 2042 5\' 8"  (1.727 m)     Head Circumference --      Peak Flow --      Pain Score 06/25/23 2041 5     Pain Loc --      Pain Education --      Exclude from Growth Chart --     Most recent vital signs: Vitals:   06/25/23 2041  BP: (!) 161/98  Pulse: 80  Resp: 18  Temp: 98.3 F (36.8 C)  SpO2: 97%    General: Awake, no distress.  CV:  Good peripheral perfusion.  Resp:  Normal effort.  Abd:  No distention.  MSK:  No deformity noted.  No asymmetry noted to the left leg when compared to the right.  No erythema, bruising, signs  of trauma.  Full active and passive range of motion of the ankle and knee.  No palpable popliteal masses.  No signs of ischemia. Neuro:  No focal deficits appreciated. Other:     ED Results / Procedures / Treatments   Labs (all labs ordered are listed, but only abnormal results are displayed) Labs Reviewed - No data to display  EKG   RADIOLOGY Venous ultrasound interpreted by me without signs of DVT.  Official radiology report(s): US Venous Img Lower Unilateral Left  Result Date: 06/26/2023 CLINICAL DATA:  Left lower leg pain. EXAM: LEFT LOWER EXTREMITY VENOUS DOPPLER ULTRASOUND TECHNIQUE: Gray-scale sonography with compression, as well as color and duplex ultrasound, were performed to evaluate the deep venous system(s) from the level of the common femoral vein through the popliteal and proximal calf veins. COMPARISON:  None Available. FINDINGS: VENOUS Normal compressibility of the common femoral, superficial femoral, and popliteal veins, as well as the visualized calf veins. Visualized portions of profunda femoral vein  and great saphenous vein unremarkable. No filling defects to suggest DVT on grayscale or color Doppler imaging. Doppler waveforms show normal direction of venous flow, normal respiratory plasticity and response to augmentation. Limited views of the contralateral common femoral vein are unremarkable. OTHER None. Limitations: none IMPRESSION: No evidence of DVT within the LEFT lower extremity. Electronically Signed   By: Aram Candela M.D.   On: 06/26/2023 01:25    PROCEDURES and INTERVENTIONS:  Procedures  Medications - No data to display   IMPRESSION / MDM / ASSESSMENT AND PLAN / ED COURSE  I reviewed the triage vital signs and the nursing notes.  Differential diagnosis includes, but is not limited to, DVT, muscle spasm, arterial ischemia, chronic venous stasis dermatitis, Baker's cyst  {Patient presents with symptoms of an acute illness or injury that is  potentially life-threatening.  Patient presents with subacute intermittent left leg pain without signs of acute pathology and suitable for outpatient management.  He looks well with a reassuring exam.  Minimal localized tenderness.  Suspect muscular etiology.  Negative DVT ultrasound and reassuring exam regarding arterial pathology, joint pathology.  Discussed PCP follow-up and return precautions.      FINAL CLINICAL IMPRESSION(S) / ED DIAGNOSES   Final diagnoses:  Left leg pain     Rx / DC Orders   ED Discharge Orders     None        Note:  This document was prepared using Dragon voice recognition software and may include unintentional dictation errors.   Delton Prairie, MD 06/26/23 [phone]

## 2023-06-25 NOTE — ED Triage Notes (Signed)
Pt presents to ER with c/o intermittent lower leg pain that started around 2 weeks ago.  Pt states pain has moved from his calf area, and now feels like it is behind his left knee.  Pt denies any redness, warmth, or swelling to left.  Denies taking any blood thinners, and has never had any blood clots.  Pt is otherwise A&O x4 and in NAD at this time.

## 2023-06-26 NOTE — Discharge Instructions (Signed)
Use Tylenol for pain and fevers.  Up to 1000 mg per dose, up to 4 times per day.  Do not take more than 4000 mg of Tylenol/acetaminophen within 24 hours..  No signs of DVT to your left leg

## 2023-12-02 ENCOUNTER — Encounter: Payer: Self-pay | Admitting: Family Medicine

## 2023-12-02 ENCOUNTER — Ambulatory Visit: Admitting: Family Medicine

## 2023-12-02 DIAGNOSIS — N341 Nonspecific urethritis: Secondary | ICD-10-CM

## 2023-12-02 DIAGNOSIS — Z113 Encounter for screening for infections with a predominantly sexual mode of transmission: Secondary | ICD-10-CM

## 2023-12-02 DIAGNOSIS — B356 Tinea cruris: Secondary | ICD-10-CM

## 2023-12-02 LAB — HM HIV SCREENING LAB: HM HIV Screening: NEGATIVE

## 2023-12-02 LAB — GRAM STAIN

## 2023-12-02 LAB — HM HEPATITIS C SCREENING LAB: HM Hepatitis Screen: NEGATIVE

## 2023-12-02 MED ORDER — CLOTRIMAZOLE-BETAMETHASONE 1-0.05 % EX CREA: 1.0000 | TOPICAL_CREAM | Freq: Every day | CUTANEOUS | Status: AC

## 2023-12-02 MED ORDER — DOXYCYCLINE HYCLATE 100 MG PO TABS: 100.0000 mg | ORAL_TABLET | Freq: Two times a day (BID) | ORAL | Status: AC

## 2023-12-02 NOTE — Progress Notes (Addendum)
 Pt is here for STD screening.The patient was dispensed doxycycline 100 mg  2x/day and clotrimazole today. I provided counseling today regarding the medication. We discussed the medication, the side effects and when to call clinic. Patient given the opportunity to ask questions for clarification. Condoms declined. Sonda Primes, RN.

## 2023-12-02 NOTE — Progress Notes (Signed)
 [facility] Department STI clinic [address], [address] [place] [address] Main phone: [phone]  STI screening visit  Subjective:  Marvin Coleman is a 73 y.o. male being seen today for an STI screening visit. The patient reports they do have symptoms.    Patient has the following medical conditions:  Patient Active Problem List   Diagnosis Date Noted   Chronic venous insufficiency 06/28/2017   Lymphedema 06/28/2017   Hyperlipemia 05/17/2017   Swelling of limb 05/17/2017    Chief Complaint  Patient presents with   SEXUALLY TRANSMITTED DISEASE    Pt is here STD screening and has symptoms    HPI HPI Patient reports to clinic for STI testing. States he has a "spot" on his penis that comes and goes, and has been there about 1 year. Reports it is occasionally itchy. Denies any discharge. Reports his last sex was more than 1 year ago when he received oral sex  STI screening history: Last HIV test per patient/review of record was No results found for: "HMHIVSCREEN" No results found for: "HIV"  Last HEPC test per patient/review of record was No results found for: "HMHEPCSCREEN" No components found for: "HEPC"   Last HEPB test per patient/review of record was No components found for: "HMHEPBSCREEN"   Fertility: Does the patient or their partner desires a pregnancy in the next year? No  Screening for MPX risk: Does the patient have an unexplained rash? No Is the patient MSM? No Does the patient endorse multiple sex partners or anonymous sex partners? No Did the patient have close or sexual contact with a person diagnosed with MPX? No Has the patient traveled outside the [place] where MPX is endemic? No Is there a high clinical suspicion for MPX-- evidenced by one of the following No  -Unlikely to be chickenpox  -Lymphadenopathy  -Rash that present in same phase of evolution on any given body part   See flowsheet for further details and  programmatic requirements.   Immunization History  Administered Date(s) Administered   Dtap, Unspecified 07/13/2014   Influenza-Unspecified 06/10/2020   PFIZER Comirnaty(Gray Top)Covid-19 Tri-Sucrose Vaccine 06/14/2020   PFIZER(Purple Top)SARS-COV-2 Vaccination 10/23/2019, 11/17/2019   Pneumococcal Conjugate-13 08/08/2017   Pneumococcal Polysaccharide-23 07/13/2014, 08/13/2018   Respiratory Syncytial Virus Vaccine,Recomb Aduvanted(Arexvy) 06/10/2022   Zoster Recombinant(Shingrix) 03/03/2018     The following portions of the patient's history were reviewed and updated as appropriate: allergies, current medications, past medical history, past social history, past surgical history and problem list.  Objective:  There were no vitals filed for this visit.  Physical Exam Exam conducted with a chaperone present [name] RN).  Constitutional:      Appearance: Normal appearance.  HENT:     Head: Normocephalic and atraumatic.     Comments: No nits or hair loss    Mouth/Throat:     Mouth: Mucous membranes are moist. No oral lesions.     Pharynx: Oropharynx is clear. No oropharyngeal exudate or posterior oropharyngeal erythema.  Eyes:     General:        Right eye: No discharge.        Left eye: No discharge.     Conjunctiva/sclera:     Right eye: Right conjunctiva is not injected. No exudate.    Left eye: Left conjunctiva is not injected. No exudate. Pulmonary:     Effort: Pulmonary effort is normal.  Abdominal:     General: Abdomen is flat.     Palpations: Abdomen  is soft. There is no hepatomegaly or mass.     Tenderness: There is no abdominal tenderness. There is no rebound.     Hernia: There is no hernia in the left inguinal area or right inguinal area.  Genitourinary:    Pubic Area: No rash or pubic lice (no nits).      Penis: Circumcised. Erythema and lesions present. No tenderness, discharge or swelling.      Testes: Normal.     Epididymis:     Right: Normal. No mass  or tenderness.     Left: Normal. No mass or tenderness.     Rectum: Normal. No tenderness (no lesions or discharge).       Comments: Penile Discharge Amount: none Color:  none  Areas in red circles- erythematous patches Lymphadenopathy:     Head:     Right side of head: No preauricular or posterior auricular adenopathy.     Left side of head: No preauricular or posterior auricular adenopathy.     Cervical: Cervical adenopathy present.     Upper Body:     Right upper body: No supraclavicular, axillary or epitrochlear adenopathy.     Left upper body: No supraclavicular, axillary or epitrochlear adenopathy.     Lower Body: No right inguinal adenopathy. No left inguinal adenopathy.  Skin:    General: Skin is warm and dry.     Findings: No lesion or rash.  Neurological:     Mental Status: He is alert and oriented to person, place, and time.     Assessment and Plan:  Marvin Coleman is a 74 y.o. male presenting to the [facility] Department for STI screening  1. Screening for venereal disease (Primary)  - Syphilis Serology, [facility] - Gram stain - Chlamydia/GC NAA, Confirmation - HIV/HCV [name]  2. Jock itch Patches on groin and on penis that come and go -endorses itching, states it appears "scaly like a fish" at times -believes this could be jock itch, but wants testing for STI today -counseled that without improvement, may need to see urology or derm  - clotrimazole-betamethasone (LOTRISONE) cream; Apply 1 Application topically daily.    Patient does have STI symptoms Patient accepted all screenings including  urine GC/Chlamydia, and blood work for HIV/Syphilis. Patient meets criteria for HepB screening? No. Ordered? not applicable Patient meets criteria for HepC screening? Yes. Ordered? yes Recommended condom use with all sex Discussed importance of condom use for STI prevention  Treat positive test results per standing order. Discussed time  line for [facility] results and that patient will be called with positive results and encouraged patient to call if he had not heard in 2 weeks Recommended repeat testing in 3 months with positive results. Recommended returning for continued or worsening symptoms.   Return if symptoms worsen or fail to improve, for STI screening.  No future appointments.  Lenice Llamas, [place]

## 2023-12-04 LAB — CHLAMYDIA/GC NAA, CONFIRMATION
Chlamydia trachomatis, NAA: NEGATIVE
Neisseria gonorrhoeae, NAA: NEGATIVE
